# Patient Record
Sex: Male | Born: 1945 | Race: White | Hispanic: No | Marital: Married | Smoking: Never smoker
Health system: Southern US, Community
[De-identification: ages and names within clinical notes are randomized; demographics above are authoritative.]

## PROBLEM LIST (undated history)

## (undated) DIAGNOSIS — N189 Chronic kidney disease, unspecified: Secondary | ICD-10-CM

## (undated) DIAGNOSIS — I10 Essential (primary) hypertension: Secondary | ICD-10-CM

## (undated) DIAGNOSIS — E119 Type 2 diabetes mellitus without complications: Secondary | ICD-10-CM

## (undated) DIAGNOSIS — E785 Hyperlipidemia, unspecified: Secondary | ICD-10-CM

## (undated) DIAGNOSIS — I499 Cardiac arrhythmia, unspecified: Secondary | ICD-10-CM

## (undated) HISTORY — DX: Type 2 diabetes mellitus without complications: E11.9

## (undated) HISTORY — DX: Essential (primary) hypertension: I10

## (undated) HISTORY — DX: Hyperlipidemia, unspecified: E78.5

## (undated) HISTORY — DX: Cardiac arrhythmia, unspecified: I49.9

## (undated) HISTORY — DX: Chronic kidney disease, unspecified: N18.9

---

## 1999-06-07 DIAGNOSIS — Z86711 Personal history of pulmonary embolism: Secondary | ICD-10-CM | POA: Insufficient documentation

## 2013-01-09 DIAGNOSIS — H43819 Vitreous degeneration, unspecified eye: Secondary | ICD-10-CM | POA: Insufficient documentation

## 2013-12-06 DIAGNOSIS — N400 Enlarged prostate without lower urinary tract symptoms: Secondary | ICD-10-CM | POA: Insufficient documentation

## 2013-12-06 DIAGNOSIS — D696 Thrombocytopenia, unspecified: Secondary | ICD-10-CM | POA: Insufficient documentation

## 2013-12-06 DIAGNOSIS — F419 Anxiety disorder, unspecified: Secondary | ICD-10-CM | POA: Insufficient documentation

## 2013-12-06 DIAGNOSIS — J45909 Unspecified asthma, uncomplicated: Secondary | ICD-10-CM | POA: Insufficient documentation

## 2013-12-06 DIAGNOSIS — I10 Essential (primary) hypertension: Secondary | ICD-10-CM | POA: Insufficient documentation

## 2013-12-06 DIAGNOSIS — E782 Mixed hyperlipidemia: Secondary | ICD-10-CM | POA: Insufficient documentation

## 2013-12-06 DIAGNOSIS — R0602 Shortness of breath: Secondary | ICD-10-CM | POA: Insufficient documentation

## 2014-08-20 DIAGNOSIS — J301 Allergic rhinitis due to pollen: Secondary | ICD-10-CM | POA: Insufficient documentation

## 2015-02-23 DIAGNOSIS — M316 Other giant cell arteritis: Secondary | ICD-10-CM | POA: Insufficient documentation

## 2015-08-18 DIAGNOSIS — R972 Elevated prostate specific antigen [PSA]: Secondary | ICD-10-CM | POA: Insufficient documentation

## 2021-06-06 DIAGNOSIS — Z7901 Long term (current) use of anticoagulants: Secondary | ICD-10-CM | POA: Insufficient documentation

## 2021-07-15 DIAGNOSIS — I4891 Unspecified atrial fibrillation: Secondary | ICD-10-CM | POA: Insufficient documentation

## 2021-07-15 DIAGNOSIS — I480 Paroxysmal atrial fibrillation: Secondary | ICD-10-CM | POA: Insufficient documentation

## 2021-08-09 ENCOUNTER — Ambulatory Visit: Payer: Medicare Other | Admitting: Cardiology

## 2021-08-12 ENCOUNTER — Ambulatory Visit: Payer: Medicare Other | Admitting: Cardiology

## 2021-08-12 ENCOUNTER — Other Ambulatory Visit: Payer: Self-pay

## 2021-08-12 ENCOUNTER — Encounter: Payer: Self-pay | Admitting: Cardiology

## 2021-08-12 VITALS — BP 160/90 | HR 76 | Temp 98.7°F | Resp 16 | Ht 71.0 in | Wt 272.2 lb

## 2021-08-12 DIAGNOSIS — I480 Paroxysmal atrial fibrillation: Secondary | ICD-10-CM

## 2021-08-12 DIAGNOSIS — I10 Essential (primary) hypertension: Secondary | ICD-10-CM

## 2021-08-12 DIAGNOSIS — E782 Mixed hyperlipidemia: Secondary | ICD-10-CM

## 2021-08-12 DIAGNOSIS — I5032 Chronic diastolic (congestive) heart failure: Secondary | ICD-10-CM

## 2021-08-12 DIAGNOSIS — E1122 Type 2 diabetes mellitus with diabetic chronic kidney disease: Secondary | ICD-10-CM

## 2021-08-12 MED ORDER — HYDRALAZINE HCL 25 MG PO TABS: 25.0000 mg | ORAL_TABLET | Freq: Three times a day (TID) | ORAL | 2 refills | Status: DC

## 2021-08-12 MED ORDER — ISOSORBIDE DINITRATE 30 MG PO TABS: 30.0000 mg | ORAL_TABLET | Freq: Three times a day (TID) | ORAL | 2 refills | Status: DC

## 2021-08-12 NOTE — Progress Notes (Signed)
Primary Physician/Referring:  Kathalene Frames, MD  Patient ID: Omar Chen, male    DOB: Feb 03, 1946, 76 y.o.   MRN: [id number]  Chief Complaint  Patient presents with   Atrial Fibrillation   New Patient (Initial Visit)    Referred by Okey Dupre   HPI:    Omar Chen  is a 76 y.o. Caucasian male patient with history of primary hypertension, hyperlipidemia, type 2 diabetes mellitus with chronic kidney disease, morbid obesity, was in [facility] and admitted for ventral hernia repair developed postoperative complications needing long stay in the hospital and also developed paroxysmal atrial fibrillation underwent cardioversion and started on amiodarone and discharged home on Eliquis, he now presents to establish cardiac care as he was advised to follow-up with and establish with cardiology for further management of his cardiac issues.  He is accompanied by his wife.  States that he is slowly recuperating from his recent surgery, however he has home physical therapy, has slowly started ambulating and was using a walker until a week ago to 2 weeks ago.  He has chronic dyspnea on exertion but has gotten worse since recent hospitalization.  No PND or orthopnea.  Denies chest pain or palpitations.  He would like to come off of amiodarone.  Past Medical History:  Diagnosis Date   Arrhythmia    Atrial fib post surgery Dec 2022   Chronic kidney disease    Diabetes mellitus without complication ([place])    Hyperlipidemia    Hypertension    History reviewed. No pertinent surgical history. Family History  Problem Relation Age of Onset   Rheum arthritis Mother    Cancer - Lung Father    Pancreatic cancer Sister     Social History   Tobacco Use   Smoking status: Never   Smokeless tobacco: Never  Substance Use Topics   Alcohol use: Yes    Alcohol/week: 1.0 standard drink    Types: 1 Standard drinks or equivalent per week   Marital Status:    ROS  Review of  Systems  Cardiovascular:  Positive for dyspnea on exertion and leg swelling. Negative for chest pain.  Objective  Blood pressure (!) 160/90, pulse 76, temperature 98.7 F (37.1 C), temperature source Temporal, resp. rate 16, height 5\' 11"  (1.803 m), weight 272 lb 3.2 oz (123.5 kg), SpO2 92 %. Body mass index is 37.96 kg/m.  Vitals with BMI 08/12/2021 08/12/2021  Height - 5\' 11"   Weight - 272 lbs 3 oz  BMI - [id number]  Systolic 0000000 [id number]  Diastolic 90 [id number]  Pulse - 76    Physical Exam Constitutional:      Comments: Morbidly obese in no acute distress.  Neck:     Vascular: No JVD.  Cardiovascular:     Rate and Rhythm: Normal rate and regular rhythm.     Pulses: Intact distal pulses.          Carotid pulses are 2+ on the right side and 2+ on the left side.      Popliteal pulses are 2+ on the right side and 2+ on the left side.       Dorsalis pedis pulses are 1+ on the right side and 0 on the left side.       Posterior tibial pulses are 2+ on the right side and 0 on the left side.     Heart sounds: Normal heart sounds. No murmur heard.   No gallop.  Pulmonary:     Effort: Pulmonary  effort is normal.     Breath sounds: Rales (Bibasilar [address] up) present.  Abdominal:     General: Bowel sounds are normal.     Palpations: Abdomen is soft.     Comments: Obese. Pannus present  Musculoskeletal:     Right lower leg: Edema (Non pitting edema with chronic venostasis dermatitis) present.     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     Medications and allergies  No Known Allergies   Medication list after today's encounter   Current Outpatient Medications:    amLODipine-benazepril (LOTREL) 10-20 MG capsule, Take 1 capsule by mouth See admin instructions., Disp: , Rfl:    ELIQUIS 5 MG TABS tablet, Take 5 mg by mouth 2 (two) times daily., Disp: , Rfl:    fluticasone (FLONASE) 50 MCG/ACT nasal spray, Place 1-2 sprays into the nose as needed for  allergies., Disp: , Rfl:    furosemide (LASIX) 20 MG tablet, Take 20 mg by mouth., Disp: , Rfl:    hydrALAZINE (APRESOLINE) 25 MG tablet, Take 1 tablet (25 mg total) by mouth 3 (three) times daily., Disp: 90 tablet, Rfl: 2   isosorbide dinitrate (ISORDIL) 30 MG tablet, Take 1 tablet (30 mg total) by mouth 3 (three) times daily., Disp: 90 tablet, Rfl: 2   metoprolol succinate (TOPROL-XL) 25 MG 24 hr tablet, Take 1 tablet by mouth daily., Disp: , Rfl:    olopatadine (PATANOL) 0.1 % ophthalmic solution, Apply 1-2 drops to eye., Disp: , Rfl:    pravastatin (PRAVACHOL) 40 MG tablet, Take 1 tablet by mouth daily., Disp: , Rfl:    TRADJENTA 5 MG TABS tablet, Take 5 mg by mouth daily., Disp: , Rfl:   Laboratory examination:   No results for input(s): NA, K, CL, CO2, GLUCOSE, BUN, CREATININE, CALCIUM, GFRNONAA, GFRAA in the last 8760 hours. CrCl cannot be calculated (No successful lab value found.).  No flowsheet data found. No flowsheet data found.  Lipid Panel No results for input(s): CHOL, TRIG, LDLCALC, VLDL, HDL, CHOLHDL, LDLDIRECT in the last 8760 hours. Lipid Panel  No results found for: CHOL, TRIG, HDL, CHOLHDL, VLDL, LDLCALC, LDLDIRECT, LABVLDL   HEMOGLOBIN A1C No results found for: HGBA1C, MPG TSH No results for input(s): TSH in the last 8760 hours.  Radiology:     Cardiac Studies:    EKG:   EKG 08/12/2021: Sinus rhythm with first-degree AV block at rate of 79 bpm, left atrial enlargement, left axis deviation, left anterior fascicular block.  Poor R wave progression, cannot exclude anteroseptal infarct old.  Nonspecific T abnormality.    Assessment     ICD-10-CM   1. Paroxysmal atrial fibrillation (HCC)  I48.0 EKG 12-Lead    2. Mixed hyperlipidemia  E78.2     3. Primary hypertension  I10 hydrALAZINE (APRESOLINE) 25 MG tablet    isosorbide dinitrate (ISORDIL) 30 MG tablet    CBC    COMPLETE METABOLIC PANEL WITH GFR    Lipid Panel With LDL/HDL Ratio    TSH    4. Type 2  diabetes mellitus with stage 3a chronic kidney disease, without long-term current use of insulin (HCC)  E11.22 Hgb A1c w/o eAG   N18.31     5. Chronic diastolic (congestive) heart failure (HCC)  I50.32 DG Chest Portable 2 Views    Pro b natriuretic peptide (BNP)       Medications Discontinued During This Encounter  Medication Reason   amiodarone (PACERONE) 200 MG tablet  Discontinued by provider    Meds ordered this encounter  Medications   hydrALAZINE (APRESOLINE) 25 MG tablet    Sig: Take 1 tablet (25 mg total) by mouth 3 (three) times daily.    Dispense:  90 tablet    Refill:  2   isosorbide dinitrate (ISORDIL) 30 MG tablet    Sig: Take 1 tablet (30 mg total) by mouth 3 (three) times daily.    Dispense:  90 tablet    Refill:  2   Orders Placed This Encounter  Procedures   DG Chest Portable 2 Views    Standing Status:   Future    Standing Expiration Date:   08/13/2022    Order Specific Question:   Reason for Exam (SYMPTOM  OR DIAGNOSIS REQUIRED)    Answer:   Shortness of breath    Order Specific Question:   Preferred imaging location?    Answer:   [facility]   CBC   COMPLETE METABOLIC PANEL WITH GFR   Lipid Panel With LDL/HDL Ratio   TSH   Pro b natriuretic peptide (BNP)   Hgb A1c w/o eAG   EKG 12-Lead   Recommendations:   Joaquim Buttry is a 76 y.o.  Caucasian male patient with history of primary hypertension, hyperlipidemia, type 2 diabetes mellitus with chronic kidney disease, morbid obesity, was in [facility] and admitted for ventral hernia repair developed postoperative complications needing long stay in the hospital and also developed paroxysmal atrial fibrillation underwent cardioversion and started on amiodarone and discharged home on Eliquis, he now presents to establish cardiac care as he was advised to follow-up with and establish with cardiology for further management of his cardiac issues.  He is accompanied by his wife.  Extremely complex  patient with multiple medical comorbidities, presents to establish cardiac care, he is not in acute decompensated heart failure but does have bibasilar crackles, would like to obtain a baseline chest x-ray, suspect atelectasis.  Blood pressure is markedly elevated, I started him on isosorbide dinitrate and hydralazine 3 times daily.  Complete set of labs ordered as above.  We will send a copy to Dr. Okey Dupre.  We will try to obtain his cardiac work-up from previous.  Does not appear that he has had cardiac work-up.  With regard to atrial fibrillation, he is maintaining sinus rhythm, suspect sepsis related and acute decompensated heart failure related atrial fibrillation when he was admitted postoperatively after complications from ventral hernia repair in [place].  They are very eager to come off of amiodarone which I have discontinued for now.  I will look at his prior cardiac work-up and depending upon his previous work-up, he may need further work-up.  Including ischemic work-up.  I would like to see him back in 4 weeks for follow-up.  I have had extensive discussion regarding weight loss and increasing his physical activity. [facility] diet (low glycemic) sheet given to the patient.  Discussed regarding DASH diet and salt restriction.  This is a 90-minute office visit encounter.  I have requested Dr. Henry Russel, his family practitioner at [facility] in [place] to send me cardiology records, his cell phone number is [phone] and his office number is [phone].   Adrian Prows, MD, [facility] 08/12/2021, 12:52 PM Office: [phone]

## 2021-08-16 ENCOUNTER — Other Ambulatory Visit: Payer: Self-pay | Admitting: Cardiology

## 2021-08-16 ENCOUNTER — Ambulatory Visit
Admission: RE | Admit: 2021-08-16 | Discharge: 2021-08-16 | Disposition: A | Discharge status: Home / Self Care | Payer: Medicare Other | Source: Ambulatory Visit | Attending: Cardiology | Admitting: Cardiology

## 2021-08-16 DIAGNOSIS — I5032 Chronic diastolic (congestive) heart failure: Secondary | ICD-10-CM

## 2021-08-16 NOTE — Progress Notes (Signed)
Chest x-ray PA lateral view 08/16/2021: ?The heart and mediastinal contours are within normal limits. Aortic calcification. Slightly prominent hilar vasculature. ?No focal consolidation. No pulmonary edema. No pleural effusion. No pneumothorax.

## 2021-08-17 LAB — LIPID PANEL WITH LDL/HDL RATIO
Cholesterol, Total: 157 mg/dL (ref 100–199)
HDL: 52 mg/dL (ref >39)
LDL Chol Calc (NIH): 87 mg/dL (ref 0–99)
LDL/HDL Ratio: 1.7 ratio (ref 0.0–3.6)
Triglycerides: 99 mg/dL (ref 0–149)
VLDL Cholesterol Cal: 18 mg/dL (ref 5–40)

## 2021-08-17 LAB — CBC
Hematocrit: 36.4 % — ABNORMAL LOW (ref 37.5–51.0)
Hemoglobin: 12.3 g/dL — ABNORMAL LOW (ref 13.0–17.7)
MCH: 29.4 pg (ref 26.6–33.0)
MCHC: 33.8 g/dL (ref 31.5–35.7)
MCV: 87 fL (ref 79–97)
Platelets: 183 10*3/uL (ref 150–450)
RBC: 4.18 x10E6/uL (ref 4.14–5.80)
RDW: 15.1 % (ref 11.6–15.4)
WBC: 8.5 10*3/uL (ref 3.4–10.8)

## 2021-08-17 LAB — HGB A1C W/O EAG: Hgb A1c MFr Bld: 5.9 % — ABNORMAL HIGH (ref 4.8–5.6)

## 2021-08-17 LAB — TSH: TSH: 3.58 u[IU]/mL (ref 0.450–4.500)

## 2021-08-17 LAB — PRO B NATRIURETIC PEPTIDE: NT-Pro BNP: 662 pg/mL — ABNORMAL HIGH (ref 0–376)

## 2021-09-06 ENCOUNTER — Encounter: Payer: Self-pay | Admitting: Cardiology

## 2021-09-06 ENCOUNTER — Ambulatory Visit: Payer: Medicare Other | Admitting: Cardiology

## 2021-09-06 VITALS — BP 142/60 | HR 67 | Temp 98.3°F | Resp 16 | Ht 71.0 in | Wt 277.6 lb

## 2021-09-06 DIAGNOSIS — I5032 Chronic diastolic (congestive) heart failure: Secondary | ICD-10-CM

## 2021-09-06 DIAGNOSIS — I10 Essential (primary) hypertension: Secondary | ICD-10-CM

## 2021-09-06 DIAGNOSIS — E782 Mixed hyperlipidemia: Secondary | ICD-10-CM

## 2021-09-06 DIAGNOSIS — I480 Paroxysmal atrial fibrillation: Secondary | ICD-10-CM

## 2021-09-06 NOTE — Progress Notes (Addendum)
? ?Primary Physician/Referring:  Kathalene Frames, MD ? ?Patient ID: Omar Chen, male    DOB: 1945/08/14, 76 y.o.   MRN: [id number] ? ?No chief complaint on file. ? ?HPI:   ? ?Omar Chen  is a 76 y.o. Caucasian male patient with history of primary hypertension, hyperlipidemia, type 2 diabetes mellitus with chronic kidney disease, morbid obesity, was in [facility] and admitted for ventral hernia repair developed postoperative complications needing long stay in the hospital and also developed paroxysmal atrial fibrillation underwent cardioversion and started on amiodarone and discharged home on Eliquis. He is accompanied by his wife.  I had seen him 6 weeks ago. ? ?He and his wife have made significant lifestyle changes with regard to diet.  He is also ambulating much more frequently.  Home physical therapy has seen a significant improvement in his overall activity and balance as well.  Dyspnea has remained stable.  No PND or orthopnea.  Denies chest pain or palpitations.    ? ?Past Medical History:  ?Diagnosis Date  ? Arrhythmia   ? Atrial fib post surgery Dec 2022  ? Chronic kidney disease   ? Diabetes mellitus without complication ([place])   ? Hyperlipidemia   ? Hypertension   ? ?History reviewed. No pertinent surgical history. ?Family History  ?Problem Relation Age of Onset  ? Rheum arthritis Mother   ? Cancer - Lung Father   ? Pancreatic cancer Sister   ?  ?Social History  ? ?Tobacco Use  ? Smoking status: Never  ? Smokeless tobacco: Never  ?Substance Use Topics  ? Alcohol use: Yes  ?  Alcohol/week: 1.0 standard drink  ?  Types: 1 Standard drinks or equivalent per week  ? ?Marital Status:    ?ROS  ?Review of Systems  ?Cardiovascular:  Positive for dyspnea on exertion and leg swelling. Negative for chest pain.  ?Objective  ?Blood pressure (!) 142/60, pulse 67, temperature 98.3 ?F (36.8 ?C), temperature source Temporal, resp. rate 16, height $RemoveBe'5\' 11"'HOEhuKCRB$  (1.803 m), weight 277 lb 9.6 oz (125.9  kg), SpO2 95 %. Body mass index is 38.72 kg/m?.  ? ?  09/06/2021  ? 10:39 AM 09/06/2021  ? 10:30 AM 08/12/2021  ? 11:54 AM  ?Vitals with BMI  ?Height  $Remove'5\' 11"'vWtyndw$    ?Weight  277 lbs 10 oz   ?BMI  38.73   ?Systolic [phone]  ?Diastolic 60 70 90  ?Pulse  67   ?  ?Physical Exam ?Constitutional:   ?   Comments: Morbidly obese in no acute distress.  ?Neck:  ?   Vascular: No JVD.  ?Cardiovascular:  ?   Rate and Rhythm: Normal rate and regular rhythm.  ?   Pulses: Intact distal pulses.     ?     Carotid pulses are 2+ on the right side and 2+ on the left side. ?     Popliteal pulses are 2+ on the right side and 2+ on the left side.  ?     Dorsalis pedis pulses are 1+ on the right side and 0 on the left side.  ?     Posterior tibial pulses are 2+ on the right side and 0 on the left side.  ?   Heart sounds: Normal heart sounds. No murmur heard. ?  No gallop.  ?Pulmonary:  ?   Effort: Pulmonary effort is normal.  ?   Breath sounds: Normal breath sounds. No rales.  ?Abdominal:  ?   General: Bowel sounds are  normal.  ?   Palpations: Abdomen is soft.  ?   Comments: Obese. Pannus present  ?Musculoskeletal:  ?   Right lower leg: Edema (Non pitting edema with chronic venostasis dermatitis) present.  ?   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  ?  ?Medications and allergies  ?No Known Allergies  ? ?Medication list after today's encounter  ? ?Current Outpatient Medications:  ?  amLODipine-benazepril (LOTREL) 10-20 MG capsule, Take 1 capsule by mouth See admin instructions., Disp: , Rfl:  ?  ELIQUIS 5 MG TABS tablet, Take 5 mg by mouth 2 (two) times daily., Disp: , Rfl:  ?  fluticasone (FLONASE) 50 MCG/ACT nasal spray, Place 1-2 sprays into the nose as needed for allergies., Disp: , Rfl:  ?  furosemide (LASIX) 20 MG tablet, Take 20 mg by mouth., Disp: , Rfl:  ?  hydrALAZINE (APRESOLINE) 25 MG tablet, Take 1 tablet (25 mg total) by mouth 3 (three) times daily., Disp: 90 tablet, Rfl: 2 ?  isosorbide  dinitrate (ISORDIL) 30 MG tablet, Take 1 tablet (30 mg total) by mouth 3 (three) times daily., Disp: 90 tablet, Rfl: 2 ?  metoprolol succinate (TOPROL-XL) 25 MG 24 hr tablet, Take 1 tablet by mouth daily., Disp: , Rfl:  ?  pravastatin (PRAVACHOL) 40 MG tablet, Take 1 tablet by mouth daily., Disp: , Rfl:  ?  TRADJENTA 5 MG TABS tablet, Take 5 mg by mouth daily., Disp: , Rfl:  ?  olopatadine (PATANOL) 0.1 % ophthalmic solution, Apply 1-2 drops to eye. (Patient not taking: Reported on 09/06/2021), Disp: , Rfl:  ? ? ?Laboratory examination:  ? ?No results for input(s): NA, K, CL, CO2, GLUCOSE, BUN, CREATININE, CALCIUM, GFRNONAA, GFRAA in the last 8760 hours. ?CrCl cannot be calculated (No successful lab value found.).  ?   ? View : No data to display.  ?  ?  ?  ? ? ?  Latest Ref Rng & Units 08/16/2021  ? 10:32 AM  ?CBC  ?WBC 3.4 - 10.8 x10E3/uL 8.5    ?Hemoglobin 13.0 - 17.7 g/dL 12.3    ?Hematocrit 37.5 - 51.0 % 36.4    ?Platelets 150 - 450 x10E3/uL 183    ? ?Lipid Panel ?Recent Labs  ?  08/16/21 ?1032  ?CHOL 157  ?TRIG 99  ?[place] 87  ?HDL 52  ? ?Lipid Panel  ?   ?Component Value Date/Time  ? CHOL 157 08/16/2021 1032  ? TRIG 99 08/16/2021 1032  ? HDL 52 08/16/2021 1032  ? [place] 87 08/16/2021 1032  ? LABVLDL 18 08/16/2021 1032  ?  ? ?HEMOGLOBIN A1C ?Lab Results  ?Component Value Date  ? HGBA1C 5.9 (H) 08/16/2021  ? ?TSH ?Recent Labs  ?  08/16/21 ?1032  ?TSH 3.580  ? ? ?Radiology:  ? ?Chest x-ray PA and lateral view 08/16/2021: ?The heart and mediastinal contours are within normal limits. Aortic ?calcification. Slightly prominent hilar vasculature. ?No focal consolidation. No pulmonary edema. No pleural effusion. No ?pneumothorax. ? ?Cardiac Studies:  ? ?TTE 03/27/2021:  ?Normal LV systolic function, EF 55 to 60%.  Normal RV systolic function.  No pericardial effusion.  Left atrium is moderately enlarged.  ? ?TEE guided direct-current cardioversion 03/29/2021:  ?Normal TEE with normal RV systolic function.  No thrombus  in the left atrial appendage.  ?Successful direct-current cardioversion with 300 J x 1 from atrial fibrillation to normal sinus rhythm. ? ?EKG:  ? ?EKG 09/06/2021: ?Sinus rhythm sinus rhythm with vestibular block  at rate of 65 bpm, normal axis, poor R wave progression, cannot explain to septal infarct old.  Nonspecific T abnormality.  Compared to 08/12/2021, left axis deviation not present. ? ?Assessment  ? ?  ICD-10-CM   ?1. Paroxysmal atrial fibrillation (HCC)  I48.0 EKG 12-Lead  ?  PCV MYOCARDIAL PERFUSION WITH LEXISCAN  ?  ?2. Mixed hyperlipidemia  E78.2   ?  ?3. Primary hypertension  I10 CMP14+EGFR  ?  ?4. Chronic diastolic (congestive) heart failure (HCC)  I50.32 Pro b natriuretic peptide (BNP)  ?  PCV MYOCARDIAL PERFUSION WITH LEXISCAN  ?  ? ?  ?There are no discontinued medications. ?  ?No orders of the defined types were placed in this encounter. ? ?Orders Placed This Encounter  ?Procedures  ? Pro b natriuretic peptide (BNP)  ? CMP14+EGFR  ? PCV MYOCARDIAL PERFUSION WITH LEXISCAN  ?  Standing Status:   Future  ?  Standing Expiration Date:   11/06/2021  ? EKG 12-Lead  ? ?Recommendations:  ? ?Hanna Aultman is a 76 y.o.  Caucasian male patient with history of primary hypertension, hyperlipidemia, type 2 diabetes mellitus with chronic kidney disease, morbid obesity, was in [facility] and admitted for ventral hernia repair developed postoperative complications needing long stay in the hospital and also developed paroxysmal atrial fibrillation underwent cardioversion and started on amiodarone and discharged home on Eliquis. He is accompanied by his wife.  I had seen him on 08/12/2021.  I had added isosorbide dinitrate and hydralazine and discontinued amiodarone on his last office visit. ? ?I was able to obtain the echocardiogram that was performed on him, I have updated the echocardiogram and the TEE report.  I spoke to his PCP personally. ? ?His blood pressure is markedly improved.  He has made it a  significant amount of lifestyle changes with regard to his diet.  Although his weight is up, I am very reassured that he has good potential of weight loss and also control of his heart failure and blood press

## 2021-09-06 NOTE — Progress Notes (Signed)
EKG 09/06/2021: Sinus rhythm sinus rhythm with vestibular block at rate of 65 bpm, normal axis, poor R wave progression, cannot explain to septal infarct old.  Nonspecific T abnormality.  Compared to 08/12/2021, left axis deviation not present.

## 2021-09-06 NOTE — Addendum Note (Signed)
Addended by: Kela Millin on: 09/06/2021 11:13 AM ? ? Modules accepted: Level of Service ? ?

## 2021-09-22 ENCOUNTER — Ambulatory Visit: Payer: Medicare Other

## 2021-09-22 DIAGNOSIS — I480 Paroxysmal atrial fibrillation: Secondary | ICD-10-CM

## 2021-09-22 DIAGNOSIS — I5032 Chronic diastolic (congestive) heart failure: Secondary | ICD-10-CM

## 2021-09-27 ENCOUNTER — Telehealth: Payer: Self-pay

## 2021-09-27 NOTE — Telephone Encounter (Signed)
Patient called and stated that pharmacy only prescribed him 56 tablets, but I explained to him that they are refilling from a previous prescription, that he recently filled with 60 tablets, so I think the issue may be his insurance only paying for 56 tablets. He will contact the pharmacy. ? ?

## 2021-10-05 ENCOUNTER — Telehealth: Payer: Self-pay

## 2021-10-05 NOTE — Telephone Encounter (Signed)
Patient called for his stress test results. Please advise.  ?

## 2021-10-05 NOTE — Telephone Encounter (Signed)
Normal stress test and normal heart function

## 2021-10-08 NOTE — Telephone Encounter (Signed)
Called and spoke with patient regarding his stress test results.

## 2021-10-29 ENCOUNTER — Telehealth: Payer: Self-pay | Admitting: Cardiology

## 2021-10-29 ENCOUNTER — Other Ambulatory Visit: Payer: Self-pay

## 2021-10-29 DIAGNOSIS — I10 Essential (primary) hypertension: Secondary | ICD-10-CM

## 2021-10-29 MED ORDER — ISOSORBIDE DINITRATE 30 MG PO TABS: 30.0000 mg | ORAL_TABLET | Freq: Three times a day (TID) | ORAL | 2 refills | Status: DC

## 2021-10-29 MED ORDER — HYDRALAZINE HCL 25 MG PO TABS: 25.0000 mg | ORAL_TABLET | Freq: Three times a day (TID) | ORAL | 2 refills | Status: DC

## 2021-10-29 NOTE — Telephone Encounter (Signed)
Patient is requesting refills on his blood pressure medications. He has an appointment to discuss medication with JG on 11/12/21, but he's concerned he will run out by then.

## 2021-10-29 NOTE — Telephone Encounter (Signed)
Called and spoke to patient medication has been refilled

## 2021-11-02 ENCOUNTER — Other Ambulatory Visit: Payer: Self-pay

## 2021-11-02 ENCOUNTER — Telehealth: Payer: Self-pay | Admitting: Cardiology

## 2021-11-02 DIAGNOSIS — I10 Essential (primary) hypertension: Secondary | ICD-10-CM

## 2021-11-02 MED ORDER — ISOSORBIDE DINITRATE 30 MG PO TABS: 30.0000 mg | ORAL_TABLET | Freq: Three times a day (TID) | ORAL | 2 refills | Status: DC

## 2021-11-02 NOTE — Telephone Encounter (Signed)
Patient says he is taking two blood pressure medications. When he went to the pharmacy to pick them up, the pharmacy only had one of them (couldn't remember which one the pharmacy had). He's asking if he still needs to take both of these medications or if this means he is to discontinue the one the pharmacy did not refill.

## 2021-11-02 NOTE — Telephone Encounter (Signed)
Can you call pharmacy and find out. Jasmyne, forward to MA next time so they can investigate first

## 2021-11-03 ENCOUNTER — Encounter (HOSPITAL_BASED_OUTPATIENT_CLINIC_OR_DEPARTMENT_OTHER): Payer: Self-pay

## 2021-11-11 ENCOUNTER — Ambulatory Visit: Payer: Medicare Other | Admitting: Cardiology

## 2021-11-11 NOTE — Telephone Encounter (Signed)
Cathlean Cower Mares MA called pt and pharmacy.

## 2021-11-12 ENCOUNTER — Encounter: Payer: Self-pay | Admitting: Cardiology

## 2021-11-12 ENCOUNTER — Ambulatory Visit: Payer: Medicare Other | Admitting: Cardiology

## 2021-11-12 VITALS — BP 158/84 | HR 69 | Temp 98.1°F | Resp 17 | Ht 71.0 in | Wt 253.2 lb

## 2021-11-12 DIAGNOSIS — N1832 Chronic kidney disease, stage 3b: Secondary | ICD-10-CM

## 2021-11-12 DIAGNOSIS — I480 Paroxysmal atrial fibrillation: Secondary | ICD-10-CM

## 2021-11-12 DIAGNOSIS — I10 Essential (primary) hypertension: Secondary | ICD-10-CM

## 2021-11-12 DIAGNOSIS — I5032 Chronic diastolic (congestive) heart failure: Secondary | ICD-10-CM

## 2021-11-12 MED ORDER — RIVAROXABAN 15 MG PO TABS: 15.0000 mg | ORAL_TABLET | Freq: Every day | ORAL | 1 refills | Status: DC

## 2021-11-12 NOTE — Progress Notes (Signed)
Primary Physician/Referring:  Kathalene Frames, MD  Patient ID: Omar Chen, male    DOB: Sep 30, 1945, 76 y.o.   MRN: [id number]  Chief Complaint  Patient presents with   Follow-up   Paroxysmal atrial fibrillation [facility])    HPI:    Omar Chen  is a 76 y.o. 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 He is accompanied by his wife.    I discontinued amiodarone on 08/12/2021. He and his wife have made significant lifestyle changes with regard to diet.  He is also ambulating much more frequently.  Home physical therapy has seen a significant improvement in his overall activity and balance as well.  Dyspnea has remained stable.  No PND or orthopnea.  Denies chest pain or palpitations.   Lost about 25 pounds of weight to 30 pounds since he last saw me.  Past Medical History:  Diagnosis Date   Arrhythmia    Atrial fib post surgery Dec 2022   Chronic kidney disease    Diabetes mellitus without complication ([place])    Hyperlipidemia    Hypertension    History reviewed. No pertinent surgical history. Family History  Problem Relation Age of Onset   Rheum arthritis Mother    Cancer - Lung Father    Pancreatic cancer Sister     Social History   Tobacco Use   Smoking status: Never   Smokeless tobacco: Never  Substance Use Topics   Alcohol use: Yes    Alcohol/week: 1.0 standard drink of alcohol    Types: 1 Standard drinks or equivalent per week   Marital Status:    ROS  Review of Systems  Cardiovascular:  Positive for dyspnea on exertion and leg swelling. Negative for chest pain.   Objective   Blood pressure (!) 158/84, pulse 69, temperature 98.1 F (36.7 C), temperature source Temporal, resp. rate 17, height $RemoveBe'5\' 11"'BEREKSoxm$  (1.803 m), weight 253 lb 3.2 oz (114.9 kg), SpO2 95 %. Body mass index is 35.31 kg/m.     11/12/2021   12:41 PM 11/12/2021   11:45 AM 11/12/2021   11:44 AM  Vitals with BMI  Height   '5\' 11"'$   Weight   253 lbs 3 oz  BMI   44.81  Systolic 856 314 970  Diastolic 84 86 90  Pulse  69 69    Physical Exam Neck:     Vascular: No JVD.  Cardiovascular:     Rate and Rhythm: Normal rate and regular rhythm.     Pulses: Intact distal pulses.          Carotid pulses are 2+ on the right side and 2+ on the left side.      Popliteal pulses are 2+ on the right side and 2+ on the left side.       Dorsalis pedis pulses are 1+ on the right side and 0 on the left side.       Posterior tibial pulses are 2+ on the right side and 0 on the left side.     Heart sounds: Normal heart sounds. No murmur heard.    No gallop.  Pulmonary:     Effort: Pulmonary effort is normal.     Breath sounds:  Normal breath sounds. No rales.  Abdominal:     General: Bowel sounds are normal.     Palpations: Abdomen is soft.     Comments: Obese. Pannus present  Musculoskeletal:     Right lower leg: Edema (Non pitting edema with chronic venostasis dermatitis) present.     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     Medications and allergies  No Known Allergies   Medication list after today's encounter   Current Outpatient Medications:    amLODipine-benazepril (LOTREL) 10-20 MG capsule, Take 1 capsule by mouth See admin instructions., Disp: , Rfl:    FARXIGA 10 MG TABS tablet, Take 5 mg by mouth daily., Disp: , Rfl:    fluticasone (FLONASE) 50 MCG/ACT nasal spray, Place 1-2 sprays into the nose as needed for allergies., Disp: , Rfl:    furosemide (LASIX) 20 MG tablet, Take 20 mg by mouth., Disp: , Rfl:    hydrALAZINE (APRESOLINE) 25 MG tablet, Take 1  tablet by mouth in the morning, at noon, and at bedtime., Disp: , Rfl:    isosorbide dinitrate (ISORDIL) 30 MG tablet, Take 1 tablet (30 mg total) by mouth 3 (three) times daily., Disp: 90 tablet, Rfl: 2   metoprolol succinate (TOPROL-XL) 25 MG 24 hr tablet, Take 1 tablet by mouth daily., Disp: , Rfl:    mupirocin ointment (BACTROBAN) 2 %, Apply 1 application  topically 2 (two) times daily., Disp: , Rfl:    olopatadine (PATANOL) 0.1 % ophthalmic solution, Apply 1-2 drops to eye., Disp: , Rfl:    pravastatin (PRAVACHOL) 40 MG tablet, Take 1 tablet by mouth daily., Disp: , Rfl:    Rivaroxaban (XARELTO) 15 MG TABS tablet, Take 1 tablet (15 mg total) by mouth daily with supper., Disp: 30 tablet, Rfl: 1   Laboratory examination:   No results for input(s): "NA", "K", "CL", "CO2", "GLUCOSE", "BUN", "CREATININE", "CALCIUM", "GFRNONAA", "GFRAA" in the last 8760 hours. CrCl cannot be calculated (No successful lab value found.).      No data to display            Latest Ref Rng & Units 08/16/2021   10:32 AM  CBC  WBC 3.4 - 10.8 x10E3/uL 8.5   Hemoglobin 13.0 - 17.7 g/dL 12.3   Hematocrit 37.5 - 51.0 % 36.4   Platelets 150 - 450 x10E3/uL 183    Lipid Panel Recent Labs    08/16/21 1032  CHOL 157  TRIG 99  LDLCALC 87  HDL 52   Lipid Panel     Component Value Date/Time   CHOL 157 08/16/2021 1032   TRIG 99 08/16/2021 1032   HDL 52 08/16/2021 1032   LDLCALC 87 08/16/2021 1032   LABVLDL 18 08/16/2021 1032     HEMOGLOBIN A1C Lab Results  Component Value Date   HGBA1C 5.9 (H) 08/16/2021   TSH Recent Labs    08/16/21 1032  TSH 3.580   BNP (last 3 results) No results for input(s): "BNP" in the last 8760 hours.  ProBNP (last 3 results) Recent Labs    08/16/21 1032  PROBNP 662*    Labs 09/15/2021:  BUN 30, creatinine 2.02, EGFR 34 mL, potassium 4.0. Radiology:   Chest x-ray PA and lateral view 08/16/2021: The heart and mediastinal contours are within normal limits.  Aortic calcification. Slightly prominent hilar vasculature. No focal consolidation. No pulmonary edema. No pleural effusion. No pneumothorax.  Cardiac Studies:   TTE 03/27/2021:  Normal LV systolic function,  EF 55 to 60%.  Normal RV systolic function.  No pericardial effusion.  Left atrium is moderately enlarged.   TEE guided direct-current cardioversion 03/29/2021:  Normal TEE with normal RV systolic function.  No thrombus in the left atrial appendage.  Successful direct-current cardioversion with 300 J x 1 from atrial fibrillation to normal sinus rhythm.  PCV MYOCARDIAL PERFUSION WITH LEXISCAN 09/22/2021  Lexiscan/modified Bruce nuclear stress test performed using 1-day protocol. SPECT images show decreased tracer uptake in inferior myocardium, likely related to diaphragmatic attenuation. No definitive evidence of ischemia seen. Stress LVEF 77%. Low risk study.    EKG:   EKG 09/06/2021: Sinus rhythm sinus rhythm with vestibular block at rate of 65 bpm, normal axis, poor R wave progression, cannot explain to septal infarct old.  Nonspecific T abnormality.  Compared to 08/12/2021, left axis deviation not present.  Assessment     ICD-10-CM   1. Paroxysmal atrial fibrillation (HCC)  I48.0 EKG 12-Lead    Rivaroxaban (XARELTO) 15 MG TABS tablet    2. Primary hypertension  I10     3. Chronic diastolic (congestive) heart failure (HCC)  I50.32     4. Type 2 diabetes mellitus with stage 3b chronic kidney disease, without long-term current use of insulin (HCC)  E11.22    N18.32       CHA2DS2-VASc Score is 4.  Yearly risk of stroke: 4.8% (A, HTN, CHF, DM).  Score of 1=0.6; 2=2.2; 3=3.2; 4=4.8; 5=7.2; 6=9.8; 7=>9.8) -(CHF; HTN; vasc disease DM,  Male = 1; Age <65 =0; 65-74 = 1,  >75 =2; stroke/embolism= 2).    Medications Discontinued During This Encounter  Medication Reason   hydrALAZINE (APRESOLINE) 25 MG tablet Change in therapy   TRADJENTA 5 MG TABS tablet Change in therapy    ELIQUIS 5 MG TABS tablet Change in therapy     Meds ordered this encounter  Medications   Rivaroxaban (XARELTO) 15 MG TABS tablet    Sig: Take 1 tablet (15 mg total) by mouth daily with supper.    Dispense:  30 tablet    Refill:  1   Orders Placed This Encounter  Procedures   EKG 12-Lead   Recommendations:   Omar Chen is a 76 y.o.  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 He is accompanied by his wife.    I discontinued amiodarone on 08/12/2021, he is still maintaining sinus rhythm.  I suspect his atrial fibrillation episode was probably related to postop complications.  Now he has lost about 25 to 30 pounds in weight over the past 3 months and has made significant lifestyle changes.  Blood pressure is also significantly improved.  Extremely difficult situation with regard to anticoagulation, patient's CHA2DS2-VASc risk score is at least 4.0 hence would be high risk for thrombotic complications.  Cost and also patient feeling very cold due to being on "blood thinners" is dissuaded him from taking the medication.  However after discussions, he is agreeable to taking the medication as long as his PCP and myself make the decision.  Advised him that we could try Xarelto instead of Eliquis to see whether his symptoms would be better and also cost factor would be better.  Other option would  be to place him in  OCEANIC-AF (Asundexian - factor XIa inhibitor PO BID vs Apixaban PO BID in patients with A. Fib for stroke prevention.  I reviewed the results of the nuclear stress test, low risk stress  test.  Overall I am very impressed with his changes in lifestyle, weight loss, I would like to see him back in 6 weeks for follow-up both to discuss regarding further anticoagulation and also to reiterate compliance with weight loss and to follow-up on his blood pressure which was slightly elevated today.  Was previously on hydralazine as well which had started, presently off of it as his blood pressure has been well controlled.  We may have to reinitiate this.   In spite of diagnosis of heart failure, he is not on an ACE inhibitor or an ARB due to stage IIIb chronic kidney disease.    Adrian Prows, MD, [facility] 11/12/2021, 9:32 PM Office: [phone]

## 2021-12-06 ENCOUNTER — Encounter: Payer: Self-pay | Admitting: Cardiology

## 2021-12-06 ENCOUNTER — Ambulatory Visit: Payer: Medicare Other | Admitting: Cardiology

## 2021-12-06 VITALS — BP 130/78 | HR 59 | Temp 98.6°F | Resp 16 | Ht 71.0 in | Wt 254.8 lb

## 2021-12-06 DIAGNOSIS — Z006 Encounter for examination for normal comparison and control in clinical research program: Secondary | ICD-10-CM

## 2021-12-06 DIAGNOSIS — I480 Paroxysmal atrial fibrillation: Secondary | ICD-10-CM

## 2021-12-06 DIAGNOSIS — E782 Mixed hyperlipidemia: Secondary | ICD-10-CM

## 2021-12-06 DIAGNOSIS — I5032 Chronic diastolic (congestive) heart failure: Secondary | ICD-10-CM

## 2021-12-06 DIAGNOSIS — I10 Essential (primary) hypertension: Secondary | ICD-10-CM

## 2021-12-06 MED ORDER — HYDRALAZINE HCL 50 MG PO TABS: 50.0000 mg | ORAL_TABLET | Freq: Two times a day (BID) | ORAL | 3 refills | Status: DC

## 2021-12-06 MED ORDER — ISOSORBIDE DINITRATE 30 MG PO TABS: 30.0000 mg | ORAL_TABLET | Freq: Two times a day (BID) | ORAL | 3 refills | Status: DC

## 2021-12-06 NOTE — Progress Notes (Unsigned)
Primary Physician/Referring:  Emilio Aspen, MD  Patient ID: Omar Chen, male    DOB: February 12, 1946, 76 y.o.   MRN: [id number]  Chief Complaint  Patient presents with   Atrial Fibrillation   Hypertension   Congestive Heart Failure    3 months    HPI:    Omar Chen  is a 76 y.o. 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 I discontinued amiodarone on 08/12/2021, he is still maintaining sinus rhythm.  I suspect his atrial fibrillation episode was probably related to postop complications.  However due to high CHA2DS2-VASc score, he is continued on anticoagulation.  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  Dyspnea has remained stable.  No PND or orthopnea.  Denies chest pain or palpitations.   Lost about 25 pounds of weight to 30 pounds in the past 6 months, additional 3 pounds since her last saw him 6 weeks ago, wife is present..  Past Medical History:  Diagnosis Date   Arrhythmia    Atrial fib post surgery Dec 2022   Chronic kidney disease    Diabetes mellitus without complication (HCC)    Hyperlipidemia    Hypertension    History reviewed. No pertinent surgical history. Family History  Problem Relation Age of Onset   Rheum arthritis Mother    Cancer - Lung Father    Pancreatic cancer Sister     Social History   Tobacco Use   Smoking status: Never   Smokeless tobacco: Never  Substance Use Topics   Alcohol use: Yes    Alcohol/week: 1.0 standard drink of alcohol    Types: 1 Standard drinks or equivalent per week   Marital Status:    ROS  Review of  Systems  Cardiovascular:  Positive for dyspnea on exertion and leg swelling. Negative for chest pain.   Objective  Blood pressure 130/78, pulse (!) 59, temperature 98.6 F (37 C), temperature source Temporal, resp. rate 16, height 5\' 11"  (1.803 m), weight 254 lb 12.8 oz (115.6 kg), SpO2 95 %. Body mass index is 35.54 kg/m.     12/06/2021   10:41 AM 12/06/2021   10:28 AM 11/12/2021   12:41 PM  Vitals with BMI  Height  5\' 11"    Weight  254 lbs 13 oz   BMI  35.55   Systolic 130 154 01/12/2022  Diastolic 78 81 84  Pulse 59 65     Filed Weights   12/06/21 1028  Weight: 254 lb 12.8 oz (115.6 kg)     Physical Exam Neck:     Vascular: No JVD.  Cardiovascular:     Rate and Rhythm: Normal rate and regular rhythm.     Pulses: Intact distal pulses.          Carotid pulses are 2+ on the right side and 2+ on the left side.      Popliteal pulses are 2+ on the right side and 2+ on the left side.       Dorsalis pedis pulses are 1+ on the right side and 0 on the left side.       Posterior tibial pulses are 2+ on the right side and 0  on the left side.     Heart sounds: Normal heart sounds. No murmur heard.    No gallop.  Pulmonary:     Effort: Pulmonary effort is normal.     Breath sounds: Normal breath sounds. No rales.  Abdominal:     General: Bowel sounds are normal.     Palpations: Abdomen is soft.     Comments: Obese. Pannus present  Musculoskeletal:     Right lower leg: Edema (Non pitting edema with chronic venostasis dermatitis) present.     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     Medications and allergies  No Known Allergies   Medication list after today's encounter   Current Outpatient Medications:    amLODipine-benazepril (LOTREL) 10-20 MG capsule, Take 1 capsule by mouth See admin instructions., Disp: , Rfl:    atorvastatin (LIPITOR) 40 MG tablet, Take 40 mg by mouth daily., Disp: , Rfl:    FARXIGA 10 MG TABS tablet, Take 5 mg by  mouth daily., Disp: , Rfl:    fluticasone (FLONASE) 50 MCG/ACT nasal spray, Place 1-2 sprays into the nose as needed for allergies., Disp: , Rfl:    furosemide (LASIX) 20 MG tablet, Take 20 mg by mouth., Disp: , Rfl:    metoprolol succinate (TOPROL-XL) 25 MG 24 hr tablet, Take 1 tablet by mouth daily., Disp: , Rfl:    mupirocin ointment (BACTROBAN) 2 %, Apply 1 application  topically 2 (two) times daily., Disp: , Rfl:    olopatadine (PATANOL) 0.1 % ophthalmic solution, Apply 1-2 drops to eye., Disp: , Rfl:    hydrALAZINE (APRESOLINE) 50 MG tablet, Take 1 tablet (50 mg total) by mouth in the morning and at bedtime., Disp: 180 tablet, Rfl: 3   isosorbide dinitrate (ISORDIL) 30 MG tablet, Take 1 tablet (30 mg total) by mouth 2 (two) times daily., Disp: 180 tablet, Rfl: 3   Rivaroxaban (XARELTO) 15 MG TABS tablet, Take 1 tablet (15 mg total) by mouth daily with supper. (Patient not taking: Reported on 12/06/2021), Disp: 30 tablet, Rfl: 1   Laboratory examination:   No results for input(s): "NA", "K", "CL", "CO2", "GLUCOSE", "BUN", "CREATININE", "CALCIUM", "GFRNONAA", "GFRAA" in the last 8760 hours. CrCl cannot be calculated (No successful lab value found.).      No data to display            Latest Ref Rng & Units 08/16/2021   10:32 AM  CBC  WBC 3.4 - 10.8 x10E3/uL 8.5   Hemoglobin 13.0 - 17.7 g/dL 12.3   Hematocrit 37.5 - 51.0 % 36.4   Platelets 150 - 450 x10E3/uL 183    Lipid Panel Recent Labs    08/16/21 1032  CHOL 157  TRIG 99  LDLCALC 87  HDL 52   Lipid Panel     Component Value Date/Time   CHOL 157 08/16/2021 1032   TRIG 99 08/16/2021 1032   HDL 52 08/16/2021 1032   LDLCALC 87 08/16/2021 1032   LABVLDL 18 08/16/2021 1032     HEMOGLOBIN A1C Lab Results  Component Value Date   HGBA1C 5.9 (H) 08/16/2021   TSH Recent Labs    08/16/21 1032  TSH 3.580   BNP (last 3 results) No results for input(s): "BNP" in the last 8760 hours.  ProBNP (last 3 results) Recent  Labs    08/16/21 1032  PROBNP 662*    Labs 09/15/2021:  BUN 30, creatinine 2.02, EGFR 34 mL, potassium 4.0. Radiology:  Chest x-ray PA and lateral view 08/16/2021: The heart and mediastinal contours are within normal limits. Aortic calcification. Slightly prominent hilar vasculature. No focal consolidation. No pulmonary edema. No pleural effusion. No pneumothorax.  Cardiac Studies:   TTE 03/27/2021:  Normal LV systolic function, EF 55 to 60%.  Normal RV systolic function.  No pericardial effusion.  Left atrium is moderately enlarged.   TEE guided direct-current cardioversion 03/29/2021:  Normal TEE with normal RV systolic function.  No thrombus in the left atrial appendage.  Successful direct-current cardioversion with 300 J x 1 from atrial fibrillation to normal sinus rhythm.  PCV MYOCARDIAL PERFUSION WITH LEXISCAN 09/22/2021  Lexiscan/modified Bruce nuclear stress test performed using 1-day protocol. SPECT images show decreased tracer uptake in inferior myocardium, likely related to diaphragmatic attenuation. No definitive evidence of ischemia seen. Stress LVEF 77%. Low risk study.    EKG:  EKG 12/26/2021: Sinus rhythm with first-degree AV block at rate of 58 bpm, left atrial enlargement, nonspecific T abnormality.  No significant change from 09/06/2021.   Assessment     ICD-10-CM   1. Paroxysmal atrial fibrillation (HCC)  I48.0 EKG 12-Lead    2. Chronic diastolic (congestive) heart failure (HCC)  I50.32     3. Primary hypertension  I10 isosorbide dinitrate (ISORDIL) 30 MG tablet    hydrALAZINE (APRESOLINE) 50 MG tablet    4. Mixed hyperlipidemia  E78.2     5. Research subject  Z00.6       CHA2DS2-VASc Score is 4.  Yearly risk of stroke: 4.8% (A, HTN, CHF, DM).  Score of 1=0.6; 2=2.2; 3=3.2; 4=4.8; 5=7.2; 6=9.8; 7=>9.8) -(CHF; HTN; vasc disease DM,  Male = 1; Age <65 =0; 65-74 = 1,  >75 =2; stroke/embolism= 2).    Medications Discontinued During This Encounter   Medication Reason   pravastatin (PRAVACHOL) 40 MG tablet    isosorbide dinitrate (ISORDIL) 30 MG tablet    hydrALAZINE (APRESOLINE) 25 MG tablet Reorder     Meds ordered this encounter  Medications   isosorbide dinitrate (ISORDIL) 30 MG tablet    Sig: Take 1 tablet (30 mg total) by mouth 2 (two) times daily.    Dispense:  180 tablet    Refill:  3   hydrALAZINE (APRESOLINE) 50 MG tablet    Sig: Take 1 tablet (50 mg total) by mouth in the morning and at bedtime.    Dispense:  180 tablet    Refill:  3   Orders Placed This Encounter  Procedures   EKG 12-Lead   Recommendations:   Omar Chen is a 76 y.o.  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 I discontinued amiodarone on 08/12/2021, he is still maintaining sinus rhythm.  I suspect his atrial fibrillation episode was probably related to postop complications.  However due to high CHA2DS2-VASc score, he is continued on anticoagulation.  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  This is a 6-week office visit.  Been very strict with his diet, has lost a total of 30 pounds since he established with [place], in the last 6 weeks he has lost additional 3 pounds.  I encouraged him to continue to lose more weight.  His leg  edema has completely resolved.  He is also maintaining sinus rhythm.  Blood pressure is well controlled.  No clinical evidence of heart failure.  He is tolerating anticoagulation well.  He is presently on Eliquis to complete his home doses, he plans to switch to Xarelto due to insurance reasons.  He would be an excellent  candidate for OCEANIC-AF (Asundexian - factor XIa inhibitor PO BID vs Apixaban PO BID in patients with A. Fib for stroke prevention.  I will try to obtain a copy of his EKG documenting atrial fibrillation for randomization.  He is interested.  He is having difficulty with taking isosorbide dinitrate and also hydralazine 3 times daily, often missing afternoon doses.  As he continues to lose weight and has made lifestyle changes, blood pressure is also improved, will change it to twice daily dosing however will increase hydralazine from 25 mg to 50 mg.  Otherwise stable from cardiac standpoint, I will see him back in 6 months or sooner if problems.   In spite of diagnosis of heart failure, he is not on an ACE inhibitor or an ARB due to stage IIIb chronic kidney disease.    Adrian Prows, MD, [facility] 12/07/2021, 10:40 AM Office: [phone]

## 2021-12-07 ENCOUNTER — Encounter: Payer: Self-pay | Admitting: Cardiology

## 2021-12-10 ENCOUNTER — Telehealth: Payer: Self-pay

## 2021-12-10 NOTE — Telephone Encounter (Signed)
VM 1110:  Patient called and left a message stating that you recently increased a BP medication, but doesn't know which one and hasn't seen anything changed at the drug store yet. Please advise.

## 2021-12-12 ENCOUNTER — Encounter: Payer: Self-pay | Admitting: Cardiology

## 2021-12-12 ENCOUNTER — Other Ambulatory Visit: Payer: Self-pay | Admitting: Cardiology

## 2021-12-12 DIAGNOSIS — I10 Essential (primary) hypertension: Secondary | ICD-10-CM

## 2021-12-12 NOTE — Telephone Encounter (Signed)
I had increased Hydralazine from 25 mg to 50 mg and he has 25 mg at home, I have sent it to mail order pharmacy and should be arriving soon.

## 2021-12-14 ENCOUNTER — Encounter: Payer: Self-pay | Admitting: Physical Therapy

## 2021-12-14 ENCOUNTER — Ambulatory Visit: Payer: Medicare Other | Attending: Internal Medicine | Admitting: Physical Therapy

## 2021-12-14 ENCOUNTER — Other Ambulatory Visit: Payer: Self-pay

## 2021-12-14 DIAGNOSIS — M6281 Muscle weakness (generalized): Secondary | ICD-10-CM | POA: Diagnosis not present

## 2021-12-14 DIAGNOSIS — R2689 Other abnormalities of gait and mobility: Secondary | ICD-10-CM | POA: Insufficient documentation

## 2021-12-14 NOTE — Therapy (Signed)
OUTPATIENT PHYSICAL THERAPY THORACOLUMBAR EVALUATION   Patient Name: Omar Chen MRN: [id number] DOB:[birth year], 76 y.o., male Today's Date: 12/14/2021   PT End of Session - 12/14/21 1350     Visit Number 1    Date for PT Re-Evaluation 02/08/22    Authorization Type Medicare Part A/B, KX at visit 15    Progress Note Due on Visit 10    PT Start Time 1145    PT Stop Time 1230    PT Time Calculation (min) 45 min    Activity Tolerance Patient tolerated treatment well    Behavior During Therapy [facility] for tasks assessed/performed             Past Medical History:  Diagnosis Date   Arrhythmia    Atrial fib post surgery Dec 2022   Chronic kidney disease    Diabetes mellitus without complication ([place])    Hyperlipidemia    Hypertension    History reviewed. No pertinent surgical history. There are no problems to display for this patient.     REFERRING PROVIDER: Kathalene Frames, MD   REFERRING DIAG: R26.89 (ICD-10-CM) - Other abnormalities of gait and mobility   Rationale for Evaluation and Treatment Rehabilitation  THERAPY DIAG:  Other abnormalities of gait and mobility  Muscle weakness (generalized)  ONSET DATE: following post-op complications with ventral hernia repair, needed long stay in hospital and developed a-fib, underwent cardioversion, lost mobility, strength, endurance  SUBJECTIVE:                                                                                                                                                                                           SUBJECTIVE STATEMENT: Pt referred to O[facility] for deconditioning and mobility challenges following long stay in hospital following post-op complications from a ventral hernia repair performed 03/24/21.  Pt had pulmonary embolisms and a-fib.  Pt did inpatient rehab for 1.5-2 weeks following.  Pt was able to get off oxygen and exercise at that time. 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 No    PRECAUTIONS: Other: heart  WEIGHT BEARING RESTRICTIONS No  FALLS:  Has patient fallen in last 6 months? No but I know I'm not steady on my feet  LIVING ENVIRONMENT: Lives with: lives with their spouse  Lives in: House/apartment Stairs: Yes: Internal: 16 steps; on right going up - Pt does not use, lives on main level External: 6 railing on right Has following equipment at home: None, was told in past he should use a cane but "it doesn't do anything for me", I have a walker and used it in the past after rehab  OCCUPATION: retired  PLOF: Independent  PATIENT GOALS walk better with more confidence and balance   OBJECTIVE:   DIAGNOSTIC FINDINGS:  N/A   COGNITION:  Overall cognitive status: Within functional limits for tasks assessed     SENSATION: WFL  MUSCLE LENGTH: Hamstrings: Right 45 deg; Left 45 deg Limited gluteals and piriformis by 60% bil  POSTURE: decreased lumbar lordosis and flexed trunk   PALPATION: Bil LE edema observed  LUMBAR ROM:   Active  A/PROM  eval  Flexion 30, bends knees  Extension 5  Right lateral flexion 10  Left lateral flexion 10  Right rotation 30%  Left rotation 30%   (Blank rows = not tested)  LOWER EXTREMITY ROM:     Hip ROM grossly limited 50-75% bil  LOWER EXTREMITY MMT:     Grossly 4+/5 bil LE    FUNCTIONAL TESTS:  5 times sit to stand: 17 sec with hands Timed up and go (TUG): 11 sec Berg Balance Scale: 40/56 Dynamic Gait Index: 12/24  GAIT: Distance walked: within clinic Assistive device utilized:  None Level of assistance: Complete Independence Comments: lacks trunk rotation, wide BOS, flexed trunk    TODAY'S TREATMENT  12/14/21: Initiated HEP, see below   PATIENT EDUCATION:  Education details: Access Code: [email or web address] Person educated: Patient Education method: Explanation, Demonstration, and Handouts Education comprehension: verbalized understanding and returned demonstration   HOME EXERCISE PROGRAM: Access Code: [email or web address] URL: [email or web address] Date: 12/14/2021 Prepared by: Loistine Simas Gene Glazebrook  Exercises - Seated Hamstring Stretch  - 3 x daily - 7 x weekly - 1 sets - 3 reps - 30 hold - Hooklying Single Knee to Chest Stretch  - 1 x daily - 7 x weekly - 2 sets - 10 reps - Supine Lower Trunk Rotation  - 1 x daily - 7 x weekly - 2 sets - 10 reps - Heel Raises with Counter Support  - 1 x daily - 7 x weekly - 2 sets - 10 reps - Seated Heel Toe Raises  - 3 x daily - 7 x weekly - 1 sets - 20 reps  ASSESSMENT:  CLINICAL IMPRESSION: Patient is a 76 y.o. male who was seen today for physical therapy evaluation and treatment for decreased mobility, strength and endurance following long hospital stay due to post-op complications following a ventral hernia repair in late 2022.  Pt developed a-fib requiring cardioversion.  Pt has limited spinal ROM, LE flexibility, gait dysfuction (ind gait w/o AD but lacks rotation, uses wide BOS and trunk flexed), and decreased static and dynamic balance.  He scores 40/56 on BERG and 12/24 on DGI.  Pt will benefit from skilled PT to address findings.  He reports he does not want more than 15 min of HEP daily.     OBJECTIVE IMPAIRMENTS Abnormal gait, decreased activity tolerance, decreased balance, decreased coordination, decreased mobility, decreased ROM, decreased strength, hypomobility, impaired flexibility, and postural dysfunction.   ACTIVITY LIMITATIONS bending, squatting, stairs, transfers, bed mobility, and locomotion  level  PARTICIPATION LIMITATIONS: cleaning, driving, shopping, community activity, and yard work  PERSONAL FACTORS Age and 1-2 comorbidities: 3x abdominal hernia repair, Hx of a-fib with cardioversion, bil  LE edema, Hx of pulmonary embolis  are also affecting patient's functional outcome.   REHAB POTENTIAL: Good  CLINICAL DECISION MAKING: Stable/uncomplicated  EVALUATION COMPLEXITY: Low   GOALS: Goals reviewed with patient? Yes  SHORT TERM GOALS: Target date: 01/11/2022  Pt will be ind with HEP  Baseline: Goal status: INITIAL  2.  Pt will improve LE flexibility to [facility] to allow for greater ease with bending and bed mobility Baseline:  Goal status: INITIAL    LONG TERM GOALS: Target date: 02/08/22  Pt will be ind with advanced HEP and understand importance of compliance Baseline:  Goal status: INITIAL  2.  Pt will improve BERG balance test to at least 48/56 to demo improved safety Baseline:  Goal status: INITIAL  3.  Pt will improve DGI to at least 18/24 to demo improved safety with dynamic gait tasks. Baseline:  Goal status: INITIAL  4.  Pt will improve 5x sit to stand to </= 15 sec without use of hands to demo reduced fall risk. Baseline:  Goal status: INITIAL     PLAN: PT FREQUENCY: 1-2x/week  PT DURATION: 8 weeks  PLANNED INTERVENTIONS: Therapeutic exercises, Therapeutic activity, Neuromuscular re-education, Balance training, Gait training, Patient/Family education, Joint mobilization, Electrical stimulation, Spinal mobilization, and Manual therapy.  PLAN FOR NEXT SESSION: progress general mobility (spine ROM and stretches as tol) and functional strength and balance tasks, Pt has prior HEP from rehab he may bring to review, Pt needs HEP streamlined to 15 min daily for compliance (Per his report)   Morton Peters, PT 12/14/21 1:51 PM

## 2021-12-15 NOTE — Telephone Encounter (Signed)
Spoke with patient is doubling up already and will wait for mail order.

## 2021-12-22 ENCOUNTER — Ambulatory Visit: Payer: Medicare Other | Admitting: Physical Therapy

## 2021-12-22 ENCOUNTER — Encounter: Payer: Self-pay | Admitting: Physical Therapy

## 2021-12-22 DIAGNOSIS — R2689 Other abnormalities of gait and mobility: Secondary | ICD-10-CM | POA: Diagnosis not present

## 2021-12-22 DIAGNOSIS — M6281 Muscle weakness (generalized): Secondary | ICD-10-CM

## 2021-12-22 NOTE — Therapy (Signed)
OUTPATIENT PHYSICAL THERAPY TREATMENT NOTE   Patient Name: Omar Chen MRN: [id number] DOB:1945/06/16, 76 y.o., male Today's Date: 12/22/2021  REFERRING PROVIDER: Emilio Aspen, MD   END OF SESSION:   PT End of Session - 12/22/21 1112     Visit Number 2    Date for PT Re-Evaluation 02/08/22    Authorization Type Medicare Part A/B, KX at visit 15    Progress Note Due on Visit 10    PT Start Time 1107    PT Stop Time 1147    PT Time Calculation (min) 40 min    Activity Tolerance Patient tolerated treatment well    Behavior During Therapy [facility] for tasks assessed/performed             Past Medical History:  Diagnosis Date   Arrhythmia    Atrial fib post surgery Dec 2022   Chronic kidney disease    Diabetes mellitus without complication ([facility])    Hyperlipidemia    Hypertension    History reviewed. No pertinent surgical history. There are no problems to display for this patient.   REFERRING DIAG: R26.89 (ICD-10-CM) - Other abnormalities of gait and mobility   THERAPY DIAG:  Other abnormalities of gait and mobility  Muscle weakness (generalized)  Rationale for Evaluation and Treatment Rehabilitation  PERTINENT HISTORY: on anticoagulation, ventral hernia repair developed postoperative complications needing long stay in the hospital and also developed paroxysmal atrial fibrillation underwent cardioversion  ONSET DATE: following post-op complications with ventral hernia repair, needed long stay in hospital and developed a-fib, underwent cardioversion, lost mobility, strength, endurance    PRECAUTIONS: heart  SUBJECTIVE: I am stretching my back.  Was under the weather last 2 days so didn't do HEP those days.  PAIN:    Are you having pain?  No  OBJECTIVE:    DIAGNOSTIC FINDINGS:  N/A     COGNITION:           Overall cognitive status: Within functional limits for tasks assessed                          SENSATION: WFL   MUSCLE LENGTH: Hamstrings:  Right 45 deg; Left 45 deg Limited gluteals and piriformis by 60% bil   POSTURE: decreased lumbar lordosis and flexed trunk    PALPATION: Bil LE edema observed   LUMBAR ROM:    Active  A/PROM  eval  Flexion 30, bends knees  Extension 5  Right lateral flexion 10  Left lateral flexion 10  Right rotation 30%  Left rotation 30%   (Blank rows = not tested)   LOWER EXTREMITY ROM:      Hip ROM grossly limited 50-75% bil   LOWER EXTREMITY MMT:                Grossly 4+/5 bil LE       FUNCTIONAL TESTS:  5 times sit to stand: 17 sec with hands Timed up and go (TUG): 11 sec Berg Balance Scale: 40/56 Dynamic Gait Index: 12/24   GAIT: Distance walked: within clinic Assistive device utilized: None Level of assistance: Complete Independence Comments: lacks trunk rotation, wide BOS, flexed trunk       TODAY'S TREATMENT  12/22/21: NuStep x 4', L4 seat 13, arms 13, PT present to monitor At barre bil UE support: High knee march x 20, SLS 3x10 Rt/Lt, bil heel raise x20 Sit to stand from chair 2x 5 Tandem stance 1x30" single UE support, then  tandem fwd walk along barre x 3 laps Standing bil row green x 20 Walking in hallway: head turns Lt/Rt x 2 laps down and back, close supervision Step over hurdles along barre x 5 laps (Pt tends to circumduct and catch toe) Seated ankle DF x 20 Rockerboard 2' ant/post bil UE support  12/14/21: Initiated HEP, see below     PATIENT EDUCATION:  Education details: Access Code: [email or web address] Person educated: Patient Education method: Consulting civil engineer, Demonstration, and Handouts Education comprehension: verbalized understanding and returned demonstration     HOME EXERCISE PROGRAM: Access Code: [email or web address] URL: [email or web address] Date: 12/22/2021 Prepared by: Venetia Night Alyssa Rotondo  Exercises - Seated Hamstring Stretch  - 3 x daily - 7 x weekly - 1 sets - 3 reps - 30 hold - Hooklying Single Knee to Chest Stretch  - 1 x daily - 7 x weekly -  2 sets - 10 reps - Supine Lower Trunk Rotation  - 1 x daily - 7 x weekly - 2 sets - 10 reps - Heel Raises with Counter Support  - 1 x daily - 7 x weekly - 2 sets - 10 reps - Seated Heel Toe Raises  - 3 x daily - 7 x weekly - 1 sets - 20 reps - Standing March with Counter Support  - 1 x daily - 7 x weekly - 3 sets - 10 reps - Standing March with Counter Support  - 1 x daily - 7 x weekly - 2 sets - 20 reps - Standing Hip Abduction with Counter Support  - 1 x daily - 7 x weekly - 3 sets - 10 reps - Standing Hip Extension with Counter Support  - 1 x daily - 7 x weekly - 3 sets - 10 reps - Seated Long Arc Quad  - 1 x daily - 7 x weekly - 2 sets - 10 reps - Sit to Stand  - 1 x daily - 7 x weekly - 3 sets - 5 reps ASSESSMENT:   CLINICAL IMPRESSION: Pt arrived ready to participate in PT.  He has challenge in narrow BOS and in SLS so focus on standing marching, tandem stance, tandem gait, step overs performed alt with seated therex.  Pt demo'd gait drift with gait with head turns but this improved with repeated practice today.  Pt demo'ing improved trunk rotation and arm swing with gait since starting his back stretches.  HEP updated today.     OBJECTIVE IMPAIRMENTS Abnormal gait, decreased activity tolerance, decreased balance, decreased coordination, decreased mobility, decreased ROM, decreased strength, hypomobility, impaired flexibility, and postural dysfunction.    ACTIVITY LIMITATIONS bending, squatting, stairs, transfers, bed mobility, and locomotion level   PARTICIPATION LIMITATIONS: cleaning, driving, shopping, community activity, and yard work   [place] Age and 1-2 comorbidities: 3x abdominal hernia repair, Hx of a-fib with cardioversion, bil LE edema, Hx of pulmonary embolis  are also affecting patient's functional outcome.    REHAB POTENTIAL: Good   CLINICAL DECISION MAKING: Stable/uncomplicated   EVALUATION COMPLEXITY: Low     GOALS: Goals reviewed with patient? Yes    SHORT TERM GOALS: Target date: 01/11/2022   Pt will be ind with HEP  Baseline: Goal status: in progress   2.  Pt will improve LE flexibility to [facility] to allow for greater ease with bending and bed mobility Baseline:  Goal status: INITIAL       LONG TERM GOALS: Target date: 02/08/22   Pt will be ind with advanced HEP and understand importance of compliance  Baseline:  Goal status: INITIAL   2.  Pt will improve BERG balance test to at least 48/56 to demo improved safety Baseline:  Goal status: INITIAL   3.  Pt will improve DGI to at least 18/24 to demo improved safety with dynamic gait tasks. Baseline:  Goal status: INITIAL   4.  Pt will improve 5x sit to stand to </= 15 sec without use of hands to demo reduced fall risk. Baseline:  Goal status: INITIAL         PLAN: PT FREQUENCY: 1-2x/week   PT DURATION: 8 weeks   PLANNED INTERVENTIONS: Therapeutic exercises, Therapeutic activity, Neuromuscular re-education, Balance training, Gait training, Patient/Family education, Joint mobilization, Electrical stimulation, Spinal mobilization, and Manual therapy.   PLAN FOR NEXT SESSION: progress general mobility (spine ROM and stretches as tol) and functional strength and balance tasks, Pt has prior HEP from rehab he may bring to review, Pt needs HEP streamlined to 15 min daily for compliance (Per his report)       Morton Peters, PT 12/22/21 11:58 AM

## 2021-12-29 ENCOUNTER — Ambulatory Visit: Payer: Medicare Other | Admitting: Cardiology

## 2021-12-29 NOTE — Progress Notes (Signed)
EKG 03/26/2021: Atrial fibrillation with controlled ventricular response at rate of 89 bpm, normal axis, nonspecific T abnormality, cannot exclude lateral ischemia.  Abnormal EKG.

## 2021-12-30 ENCOUNTER — Encounter: Payer: Self-pay | Admitting: Physical Therapy

## 2021-12-30 ENCOUNTER — Ambulatory Visit: Payer: Medicare Other | Admitting: Physical Therapy

## 2021-12-30 DIAGNOSIS — M6281 Muscle weakness (generalized): Secondary | ICD-10-CM

## 2021-12-30 DIAGNOSIS — R2689 Other abnormalities of gait and mobility: Secondary | ICD-10-CM | POA: Diagnosis not present

## 2021-12-30 NOTE — Therapy (Signed)
OUTPATIENT PHYSICAL THERAPY TREATMENT NOTE   Patient Name: Blaine Hari MRN: [id number] DOB:July 28, 1945, 76 y.o., male Today's Date: 12/30/2021  REFERRING PROVIDER: Emilio Aspen, MD   END OF SESSION:   PT End of Session - 12/30/21 1105     Visit Number 3    Date for PT Re-Evaluation 02/08/22    Authorization Type Medicare Part A/B, KX at visit 15    Progress Note Due on Visit 10    PT Start Time 1101    PT Stop Time 1140    PT Time Calculation (min) 39 min    Activity Tolerance Patient tolerated treatment well    Behavior During Therapy [facility] for tasks assessed/performed              Past Medical History:  Diagnosis Date   Arrhythmia    Atrial fib post surgery Dec 2022   Chronic kidney disease    Diabetes mellitus without complication ([facility])    Hyperlipidemia    Hypertension    History reviewed. No pertinent surgical history. There are no problems to display for this patient.   REFERRING DIAG: R26.89 (ICD-10-CM) - Other abnormalities of gait and mobility   THERAPY DIAG:  Other abnormalities of gait and mobility  Muscle weakness (generalized)  Rationale for Evaluation and Treatment Rehabilitation  PERTINENT HISTORY: on anticoagulation, ventral hernia repair developed postoperative complications needing long stay in the hospital and also developed paroxysmal atrial fibrillation underwent cardioversion  ONSET DATE: following post-op complications with ventral hernia repair, needed long stay in hospital and developed a-fib, underwent cardioversion, lost mobility, strength, endurance    PRECAUTIONS: heart  SUBJECTIVE: I brought my old exercises   PAIN:    Are you having pain?  No  OBJECTIVE:    DIAGNOSTIC FINDINGS:  N/A     COGNITION:           Overall cognitive status: Within functional limits for tasks assessed                          SENSATION: WFL   MUSCLE LENGTH: Hamstrings: Right 45 deg; Left 45 deg Limited gluteals and piriformis  by 60% bil   POSTURE: decreased lumbar lordosis and flexed trunk    PALPATION: Bil LE edema observed   LUMBAR ROM:    Active  A/PROM  eval  Flexion 30, bends knees  Extension 5  Right lateral flexion 10  Left lateral flexion 10  Right rotation 30%  Left rotation 30%   (Blank rows = not tested)   LOWER EXTREMITY ROM:      Hip ROM grossly limited 50-75% bil   LOWER EXTREMITY MMT:                Grossly 4+/5 bil LE       FUNCTIONAL TESTS:  5 times sit to stand: 17 sec with hands Timed up and go (TUG): 11 sec Berg Balance Scale: 40/56 Dynamic Gait Index: 12/24   GAIT: Distance walked: within clinic Assistive device utilized: None Level of assistance: Complete Independence Comments: lacks trunk rotation, wide BOS, flexed trunk       TODAY'S TREATMENT  12/30/21: NuStep L4 x 5' seat 12/arms 12 PT present to monitor Standing bwd resistance walking 10lb x 5, supervision Seated 2x10 each, bil:  heel toe raises, LAQ, march alt LE SLS 3x10 Rt/Lt single UE support Tandem stance 2x30" single light UE support to intermittent no UE support Standing bil row blue x 20,  bil shoulder extension red x 20 (added to HEP) Sit to stand from chair x 5 with UE support High knee walk single UE support along barre x 2 laps down and back  12/22/21: NuStep x 4', L4 seat 13, arms 13, PT present to monitor At barre bil UE support: High knee march x 20, SLS 3x10 Rt/Lt, bil heel raise x20 Sit to stand from chair 2x 5 Tandem stance 1x30" single UE support, then tandem fwd walk along barre x 3 laps Standing bil row green x 20 Walking in hallway: head turns Lt/Rt x 2 laps down and back, close supervision Step over hurdles along barre x 5 laps (Pt tends to circumduct and catch toe) Seated ankle DF x 20 Rockerboard 2' ant/post bil UE support  12/14/21: Initiated HEP, see below     PATIENT EDUCATION:  Education details: Access Code: [email or web address] Person educated: Patient Education method:  Programmer, multimedia, Demonstration, and Handouts Education comprehension: verbalized understanding and returned demonstration     HOME EXERCISE PROGRAM: Access Code: [email or web address] URL: [email or web address] Date: 12/30/2021 Prepared by: Loistine Simas Janeva Peaster  Exercises - Seated Hamstring Stretch  - 3 x daily - 7 x weekly - 1 sets - 3 reps - 30 hold - Hooklying Single Knee to Chest Stretch  - 1 x daily - 7 x weekly - 2 sets - 10 reps - Supine Lower Trunk Rotation  - 1 x daily - 7 x weekly - 2 sets - 10 reps - Heel Raises with Counter Support  - 1 x daily - 7 x weekly - 2 sets - 10 reps - Seated Heel Toe Raises  - 3 x daily - 7 x weekly - 1 sets - 20 reps - Standing March with Counter Support  - 1 x daily - 7 x weekly - 3 sets - 10 reps - Standing March with Counter Support  - 1 x daily - 7 x weekly - 2 sets - 20 reps - Standing Hip Abduction with Counter Support  - 1 x daily - 7 x weekly - 3 sets - 10 reps - Standing Hip Extension with Counter Support  - 1 x daily - 7 x weekly - 3 sets - 10 reps - Seated Long Arc Quad  - 1 x daily - 7 x weekly - 2 sets - 10 reps - Sit to Stand  - 1 x daily - 7 x weekly - 3 sets - 5 reps - Standing Row with Anchored Resistance  - 1 x daily - 7 x weekly - 1 sets - 20 reps - Standing Shoulder Extension with Resistance  - 1 x daily - 7 x weekly - 1 sets - 20 reps  ASSESSMENT:   CLINICAL IMPRESSION: Pt tolerated progression of therex well today.  He demo'd improved SLS and tandem stance stability today, at times performing with intermittent periods of no UE support (brief).  PT added UE tband to standing HEP today for trunk and UE postural strength.  Good participation and work rate.     OBJECTIVE IMPAIRMENTS Abnormal gait, decreased activity tolerance, decreased balance, decreased coordination, decreased mobility, decreased ROM, decreased strength, hypomobility, impaired flexibility, and postural dysfunction.    ACTIVITY LIMITATIONS bending, squatting,  stairs, transfers, bed mobility, and locomotion level   PARTICIPATION LIMITATIONS: cleaning, driving, shopping, community activity, and yard work   PERSONAL FACTORS Age and 1-2 comorbidities: 3x abdominal hernia repair, Hx of a-fib with cardioversion, bil LE edema, Hx of pulmonary embolis  are also affecting patient's functional outcome.  REHAB POTENTIAL: Good   CLINICAL DECISION MAKING: Stable/uncomplicated   EVALUATION COMPLEXITY: Low     GOALS: Goals reviewed with patient? Yes   SHORT TERM GOALS: Target date: 01/11/2022   Pt will be ind with HEP  Baseline: Goal status: in progress   2.  Pt will improve LE flexibility to [facility] to allow for greater ease with bending and bed mobility Baseline:  Goal status: INITIAL       LONG TERM GOALS: Target date: 02/08/22   Pt will be ind with advanced HEP and understand importance of compliance Baseline:  Goal status: INITIAL   2.  Pt will improve BERG balance test to at least 48/56 to demo improved safety Baseline:  Goal status: INITIAL   3.  Pt will improve DGI to at least 18/24 to demo improved safety with dynamic gait tasks. Baseline:  Goal status: INITIAL   4.  Pt will improve 5x sit to stand to </= 15 sec without use of hands to demo reduced fall risk. Baseline:  Goal status: INITIAL         PLAN: PT FREQUENCY: 1-2x/week   PT DURATION: 8 weeks   PLANNED INTERVENTIONS: Therapeutic exercises, Therapeutic activity, Neuromuscular re-education, Balance training, Gait training, Patient/Family education, Joint mobilization, Electrical stimulation, Spinal mobilization, and Manual therapy.   PLAN FOR NEXT SESSION: progress general mobility (spine ROM and stretches as tol) and functional strength and balance tasks, Pt has prior HEP from rehab he may bring to review, Pt needs HEP streamlined to 15 min daily for compliance (Per his report)       Morton Peters, PT 12/30/21 11:43 AM

## 2022-01-04 ENCOUNTER — Ambulatory Visit: Payer: Medicare Other | Attending: Internal Medicine | Admitting: Physical Therapy

## 2022-01-04 ENCOUNTER — Encounter: Payer: Self-pay | Admitting: Physical Therapy

## 2022-01-04 DIAGNOSIS — M6281 Muscle weakness (generalized): Secondary | ICD-10-CM | POA: Diagnosis present

## 2022-01-04 DIAGNOSIS — R2689 Other abnormalities of gait and mobility: Secondary | ICD-10-CM | POA: Insufficient documentation

## 2022-01-04 NOTE — Therapy (Signed)
OUTPATIENT PHYSICAL THERAPY TREATMENT NOTE   Patient Name: Omar Chen MRN: [id number] DOB:[birth year], 76 y.o., male Today's Date: 01/04/2022  REFERRING PROVIDER: Kathalene Frames, MD   END OF SESSION:   PT End of Session - 01/04/22 1219     Visit Number 4    Date for PT Re-Evaluation 02/08/22    Authorization Type Medicare Part A/B, KX at visit 15    Progress Note Due on Visit 10    PT Start Time 1220    PT Stop Time 1255    PT Time Calculation (min) 35 min    Activity Tolerance Patient tolerated treatment well    Behavior During Therapy [facility] for tasks assessed/performed               Past Medical History:  Diagnosis Date   Arrhythmia    Atrial fib post surgery Dec 2022   Chronic kidney disease    Diabetes mellitus without complication ([place])    Hyperlipidemia    Hypertension    History reviewed. No pertinent surgical history. There are no problems to display for this patient.   REFERRING DIAG: R26.89 (ICD-10-CM) - Other abnormalities of gait and mobility   THERAPY DIAG:  Other abnormalities of gait and mobility  Muscle weakness (generalized)  Rationale for Evaluation and Treatment Rehabilitation  PERTINENT HISTORY: on anticoagulation, ventral hernia repair developed postoperative complications needing long stay in the hospital and also developed paroxysmal atrial fibrillation underwent cardioversion  ONSET DATE: following post-op complications with ventral hernia repair, needed long stay in hospital and developed a-fib, underwent cardioversion, lost mobility, strength, endurance    PRECAUTIONS: heart  SUBJECTIVE:  I need to review the band exercises.  I may need to take things more easily today;  I am a little dizzy.  PAIN:    Are you having pain?  No  OBJECTIVE:    DIAGNOSTIC FINDINGS:  N/A     COGNITION:           Overall cognitive status: Within functional limits for tasks assessed                          SENSATION: WFL   MUSCLE  LENGTH: Hamstrings: Right 45 deg; Left 45 deg Limited gluteals and piriformis by 60% bil   POSTURE: decreased lumbar lordosis and flexed trunk    PALPATION: Bil LE edema observed   LUMBAR ROM:    Active  A/PROM  eval  Flexion 30, bends knees  Extension 5  Right lateral flexion 10  Left lateral flexion 10  Right rotation 30%  Left rotation 30%   (Blank rows = not tested)   LOWER EXTREMITY ROM:      Hip ROM grossly limited 50-75% bil   LOWER EXTREMITY MMT:                Grossly 4+/5 bil LE       FUNCTIONAL TESTS:  5 times sit to stand: 17 sec with hands Timed up and go (TUG): 11 sec Berg Balance Scale: 40/56 Dynamic Gait Index: 12/24   GAIT: Distance walked: within clinic Assistive device utilized: None Level of assistance: Complete Independence Comments: lacks trunk rotation, wide BOS, flexed trunk       TODAY'S TREATMENT  01/04/22: NuStep L4 x 5' seat 12/arms 12 PT present to monitor Seated 2x10 each, bil:  heel toe raises, LAQ 2lb, march 5lb kbell on thigh SLS 3x10 Rt/Lt single UE support Tandem stance 1x30" single  light UE support to intermittent no UE support Standing red band bil shoulder ext x 20 Standing blue band bil shoulder row x 20 High knee walk single UE support along barre x 4 laps down and back  12/30/21: NuStep L4 x 5' seat 12/arms 12 PT present to monitor Standing bwd resistance walking 10lb x 5, supervision Seated 2x10 each, bil:  heel toe raises, LAQ, march alt LE SLS 3x10 Rt/Lt single UE support Tandem stance 2x30" single light UE support to intermittent no UE support Standing bil row blue x 20, bil shoulder extension red x 20 (added to HEP) Sit to stand from chair x 5 with UE support High knee walk single UE support along barre x 2 laps down and back  12/22/21: NuStep x 4', L4 seat 13, arms 13, PT present to monitor At barre bil UE support: High knee march x 20, SLS 3x10 Rt/Lt, bil heel raise x20 Sit to stand from chair 2x 5 Tandem  stance 1x30" single UE support, then tandem fwd walk along barre x 3 laps Standing bil row green x 20 Walking in hallway: head turns Lt/Rt x 2 laps down and back, close supervision Step over hurdles along barre x 5 laps (Pt tends to circumduct and catch toe) Seated ankle DF x 20 Rockerboard 2' ant/post bil UE support     PATIENT EDUCATION:  Education details: Access Code: [email or web address] Person educated: Patient Education method: Explanation, Demonstration, and Handouts Education comprehension: verbalized understanding and returned demonstration     HOME EXERCISE PROGRAM: Access Code: [email or web address] URL: [email or web address] Date: 12/30/2021 Prepared by: Venetia Night Brin Ruggerio  Exercises - Seated Hamstring Stretch  - 3 x daily - 7 x weekly - 1 sets - 3 reps - 30 hold - Hooklying Single Knee to Chest Stretch  - 1 x daily - 7 x weekly - 2 sets - 10 reps - Supine Lower Trunk Rotation  - 1 x daily - 7 x weekly - 2 sets - 10 reps - Heel Raises with Counter Support  - 1 x daily - 7 x weekly - 2 sets - 10 reps - Seated Heel Toe Raises  - 3 x daily - 7 x weekly - 1 sets - 20 reps - Standing March with Counter Support  - 1 x daily - 7 x weekly - 3 sets - 10 reps - Standing March with Counter Support  - 1 x daily - 7 x weekly - 2 sets - 20 reps - Standing Hip Abduction with Counter Support  - 1 x daily - 7 x weekly - 3 sets - 10 reps - Standing Hip Extension with Counter Support  - 1 x daily - 7 x weekly - 3 sets - 10 reps - Seated Long Arc Quad  - 1 x daily - 7 x weekly - 2 sets - 10 reps - Sit to Stand  - 1 x daily - 7 x weekly - 3 sets - 5 reps - Standing Row with Anchored Resistance  - 1 x daily - 7 x weekly - 1 sets - 20 reps - Standing Shoulder Extension with Resistance  - 1 x daily - 7 x weekly - 1 sets - 20 reps  ASSESSMENT:   CLINICAL IMPRESSION: Pt arrived with report of dizziness since awaking this AM.  He was able to participate in PT with some improvement in dizziness with  NuStep and seated therex.  Standing therex kept to minimum as Pt did not feel as steady today.  He demo'd  proper form with UE bands but needed cueing for more scapular activation with row and shoulder ext.  He is unable to balance in SLS without UE support on either LE although he can perform intermittent balance in tandem without UE support for up to 5-6 sec.  Pt with improved gait pattern demo'ing improved arm swing, trunk rotation and upright trunk.  Continue along POC.     OBJECTIVE IMPAIRMENTS Abnormal gait, decreased activity tolerance, decreased balance, decreased coordination, decreased mobility, decreased ROM, decreased strength, hypomobility, impaired flexibility, and postural dysfunction.    ACTIVITY LIMITATIONS bending, squatting, stairs, transfers, bed mobility, and locomotion level   PARTICIPATION LIMITATIONS: cleaning, driving, shopping, community activity, and yard work   [place] Age and 1-2 comorbidities: 3x abdominal hernia repair, Hx of a-fib with cardioversion, bil LE edema, Hx of pulmonary embolis  are also affecting patient's functional outcome.    REHAB POTENTIAL: Good   CLINICAL DECISION MAKING: Stable/uncomplicated   EVALUATION COMPLEXITY: Low     GOALS: Goals reviewed with patient? Yes   SHORT TERM GOALS: Target date: 01/11/2022   Pt will be ind with HEP  Baseline: Goal status: MET   2.  Pt will improve LE flexibility to [facility] to allow for greater ease with bending and bed mobility Baseline:  Goal status: ONGOING       LONG TERM GOALS: Target date: 02/08/22   Pt will be ind with advanced HEP and understand importance of compliance Baseline:  Goal status: ONGOING   2.  Pt will improve BERG balance test to at least 48/56 to demo improved safety Baseline:  Goal status: INITIAL   3.  Pt will improve DGI to at least 18/24 to demo improved safety with dynamic gait tasks. Baseline:  Goal status: INITIAL   4.  Pt will improve 5x sit to stand to </=  15 sec without use of hands to demo reduced fall risk. Baseline:  Goal status: INITIAL         PLAN: PT FREQUENCY: 1-2x/week   PT DURATION: 8 weeks   PLANNED INTERVENTIONS: Therapeutic exercises, Therapeutic activity, Neuromuscular re-education, Balance training, Gait training, Patient/Family education, Joint mobilization, Electrical stimulation, Spinal mobilization, and Manual therapy.   PLAN FOR NEXT SESSION: update 5x sit to stand and DGI measures, progress general mobility (spine ROM and stretches as tol) and functional strength and balance tasks, Pt has prior HEP from rehab he may bring to review, Pt needs HEP streamlined to 15 min daily for compliance (Per his report)       Baruch Merl, PT 01/04/22 1:06 PM

## 2022-01-18 ENCOUNTER — Ambulatory Visit: Payer: Medicare Other | Admitting: Physical Therapy

## 2022-01-25 ENCOUNTER — Other Ambulatory Visit: Payer: Self-pay | Admitting: Internal Medicine

## 2022-01-25 ENCOUNTER — Ambulatory Visit: Payer: Medicare Other | Admitting: Physical Therapy

## 2022-01-25 DIAGNOSIS — L03311 Cellulitis of abdominal wall: Secondary | ICD-10-CM

## 2022-01-25 NOTE — Therapy (Deleted)
OUTPATIENT PHYSICAL THERAPY TREATMENT NOTE   Patient Name: Omar Chen MRN: [id number] DOB:27-Jul-1945, 76 y.o., male Today's Date: 01/25/2022  REFERRING PROVIDER: Kathalene Frames, MD   END OF SESSION:       Past Medical History:  Diagnosis Date   Arrhythmia    Atrial fib post surgery Dec 2022   Chronic kidney disease    Diabetes mellitus without complication ([place])    Hyperlipidemia    Hypertension    No past surgical history on file. There are no problems to display for this patient.   REFERRING DIAG: R26.89 (ICD-10-CM) - Other abnormalities of gait and mobility   THERAPY DIAG:  No diagnosis found.  Rationale for Evaluation and Treatment Rehabilitation  PERTINENT HISTORY: on anticoagulation, ventral hernia repair developed postoperative complications needing long stay in the hospital and also developed paroxysmal atrial fibrillation underwent cardioversion  ONSET DATE: following post-op complications with ventral hernia repair, needed long stay in hospital and developed a-fib, underwent cardioversion, lost mobility, strength, endurance    PRECAUTIONS: heart  SUBJECTIVE:  I need to review the band exercises.  I may need to take things more easily today;  I am a little dizzy.  PAIN:    Are you having pain?  No  OBJECTIVE:    DIAGNOSTIC FINDINGS:  N/A     COGNITION:           Overall cognitive status: Within functional limits for tasks assessed                          SENSATION: WFL   MUSCLE LENGTH: Hamstrings: Right 45 deg; Left 45 deg Limited gluteals and piriformis by 60% bil   POSTURE: decreased lumbar lordosis and flexed trunk    PALPATION: Bil LE edema observed   LUMBAR ROM:    Active  A/PROM  eval  Flexion 30, bends knees  Extension 5  Right lateral flexion 10  Left lateral flexion 10  Right rotation 30%  Left rotation 30%   (Blank rows = not tested)   LOWER EXTREMITY ROM:      Hip ROM grossly limited 50-75% bil    LOWER EXTREMITY MMT:                Grossly 4+/5 bil LE       FUNCTIONAL TESTS:  5 times sit to stand: 17 sec with hands Timed up and go (TUG): 11 sec Berg Balance Scale: 40/56 Dynamic Gait Index: 12/24   GAIT: Distance walked: within clinic Assistive device utilized: None Level of assistance: Complete Independence Comments: lacks trunk rotation, wide BOS, flexed trunk       TODAY'S TREATMENT  01/18/22:    01/04/22: NuStep L4 x 5' seat 12/arms 12 PT present to monitor Seated 2x10 each, bil:  heel toe raises, LAQ 2lb, march 5lb kbell on thigh SLS 3x10 Rt/Lt single UE support Tandem stance 1x30" single light UE support to intermittent no UE support Standing red band bil shoulder ext x 20 Standing blue band bil shoulder row x 20 High knee walk single UE support along barre x 4 laps down and back  12/30/21: NuStep L4 x 5' seat 12/arms 12 PT present to monitor Standing bwd resistance walking 10lb x 5, supervision Seated 2x10 each, bil:  heel toe raises, LAQ, march alt LE SLS 3x10 Rt/Lt single UE support Tandem stance 2x30" single light UE support to intermittent no UE support Standing bil row blue x 20, bil shoulder extension  red x 20 (added to HEP) Sit to stand from chair x 5 with UE support High knee walk single UE support along barre x 2 laps down and back     PATIENT EDUCATION:  Education details: Access Code: [email or web address] Person educated: Patient Education method: Explanation, Demonstration, and Handouts Education comprehension: verbalized understanding and returned demonstration     HOME EXERCISE PROGRAM: Access Code: [email or web address] URL: [email or web address] Date: 12/30/2021 Prepared by: Venetia Night Nino Amano  Exercises - Seated Hamstring Stretch  - 3 x daily - 7 x weekly - 1 sets - 3 reps - 30 hold - Hooklying Single Knee to Chest Stretch  - 1 x daily - 7 x weekly - 2 sets - 10 reps - Supine Lower Trunk Rotation  - 1 x daily - 7 x weekly - 2 sets - 10  reps - Heel Raises with Counter Support  - 1 x daily - 7 x weekly - 2 sets - 10 reps - Seated Heel Toe Raises  - 3 x daily - 7 x weekly - 1 sets - 20 reps - Standing March with Counter Support  - 1 x daily - 7 x weekly - 3 sets - 10 reps - Standing March with Counter Support  - 1 x daily - 7 x weekly - 2 sets - 20 reps - Standing Hip Abduction with Counter Support  - 1 x daily - 7 x weekly - 3 sets - 10 reps - Standing Hip Extension with Counter Support  - 1 x daily - 7 x weekly - 3 sets - 10 reps - Seated Long Arc Quad  - 1 x daily - 7 x weekly - 2 sets - 10 reps - Sit to Stand  - 1 x daily - 7 x weekly - 3 sets - 5 reps - Standing Row with Anchored Resistance  - 1 x daily - 7 x weekly - 1 sets - 20 reps - Standing Shoulder Extension with Resistance  - 1 x daily - 7 x weekly - 1 sets - 20 reps  ASSESSMENT:   CLINICAL IMPRESSION: Pt arrived with report of dizziness since awaking this AM.  He was able to participate in PT with some improvement in dizziness with NuStep and seated therex.  Standing therex kept to minimum as Pt did not feel as steady today.  He demo'd proper form with UE bands but needed cueing for more scapular activation with row and shoulder ext.  He is unable to balance in SLS without UE support on either LE although he can perform intermittent balance in tandem without UE support for up to 5-6 sec.  Pt with improved gait pattern demo'ing improved arm swing, trunk rotation and upright trunk.  Continue along POC.     OBJECTIVE IMPAIRMENTS Abnormal gait, decreased activity tolerance, decreased balance, decreased coordination, decreased mobility, decreased ROM, decreased strength, hypomobility, impaired flexibility, and postural dysfunction.    ACTIVITY LIMITATIONS bending, squatting, stairs, transfers, bed mobility, and locomotion level   PARTICIPATION LIMITATIONS: cleaning, driving, shopping, community activity, and yard work   [place] Age and 1-2 comorbidities: 3x  abdominal hernia repair, Hx of a-fib with cardioversion, bil LE edema, Hx of pulmonary embolis  are also affecting patient's functional outcome.    REHAB POTENTIAL: Good   CLINICAL DECISION MAKING: Stable/uncomplicated   EVALUATION COMPLEXITY: Low     GOALS: Goals reviewed with patient? Yes   SHORT TERM GOALS: Target date: 01/11/2022   Pt will be ind with HEP  Baseline: Goal status:  MET   2.  Pt will improve LE flexibility to [facility] to allow for greater ease with bending and bed mobility Baseline:  Goal status: ONGOING       LONG TERM GOALS: Target date: 02/08/22   Pt will be ind with advanced HEP and understand importance of compliance Baseline:  Goal status: ONGOING   2.  Pt will improve BERG balance test to at least 48/56 to demo improved safety Baseline:  Goal status: INITIAL   3.  Pt will improve DGI to at least 18/24 to demo improved safety with dynamic gait tasks. Baseline:  Goal status: INITIAL   4.  Pt will improve 5x sit to stand to </= 15 sec without use of hands to demo reduced fall risk. Baseline:  Goal status: INITIAL         PLAN: PT FREQUENCY: 1-2x/week   PT DURATION: 8 weeks   PLANNED INTERVENTIONS: Therapeutic exercises, Therapeutic activity, Neuromuscular re-education, Balance training, Gait training, Patient/Family education, Joint mobilization, Electrical stimulation, Spinal mobilization, and Manual therapy.   PLAN FOR NEXT SESSION: update 5x sit to stand and DGI measures, progress general mobility (spine ROM and stretches as tol) and functional strength and balance tasks, Pt has prior HEP from rehab he may bring to review, Pt needs HEP streamlined to 15 min daily for compliance (Per his report)       Baruch Merl, PT 01/25/22 7:57 AM

## 2022-01-26 ENCOUNTER — Ambulatory Visit
Admission: RE | Admit: 2022-01-26 | Discharge: 2022-01-26 | Disposition: A | Discharge status: Home / Self Care | Payer: Medicare Other | Source: Ambulatory Visit | Attending: Internal Medicine | Admitting: Internal Medicine

## 2022-01-26 DIAGNOSIS — L03311 Cellulitis of abdominal wall: Secondary | ICD-10-CM

## 2022-02-01 ENCOUNTER — Encounter: Payer: Self-pay | Admitting: Physical Therapy

## 2022-02-01 ENCOUNTER — Ambulatory Visit: Payer: Medicare Other | Admitting: Physical Therapy

## 2022-02-01 DIAGNOSIS — M6281 Muscle weakness (generalized): Secondary | ICD-10-CM

## 2022-02-01 DIAGNOSIS — R2689 Other abnormalities of gait and mobility: Secondary | ICD-10-CM | POA: Diagnosis not present

## 2022-02-01 NOTE — Therapy (Signed)
OUTPATIENT PHYSICAL THERAPY TREATMENT NOTE   Patient Name: Omar Chen MRN: [id number] DOB:[birth year], 76 y.o., male Today's Date: 02/01/2022  REFERRING PROVIDER: Kathalene Frames, MD   END OF SESSION:   PT End of Session - 02/01/22 1231     Visit Number 5    Date for PT Re-Evaluation 02/08/22    Authorization Type Medicare Part A/B, KX at visit 15    Progress Note Due on Visit 10    PT Start Time 1231    PT Stop Time 1312    PT Time Calculation (min) 41 min    Activity Tolerance Patient tolerated treatment well    Behavior During Therapy [facility] for tasks assessed/performed                Past Medical History:  Diagnosis Date   Arrhythmia    Atrial fib post surgery Dec 2022   Chronic kidney disease    Diabetes mellitus without complication ([place])    Hyperlipidemia    Hypertension    History reviewed. No pertinent surgical history. There are no problems to display for this patient.   REFERRING DIAG: R26.89 (ICD-10-CM) - Other abnormalities of gait and mobility   THERAPY DIAG:  Other abnormalities of gait and mobility  Muscle weakness (generalized)  Rationale for Evaluation and Treatment Rehabilitation  PERTINENT HISTORY: on anticoagulation, ventral hernia repair developed postoperative complications needing long stay in the hospital and also developed paroxysmal atrial fibrillation underwent cardioversion  ONSET DATE: following post-op complications with ventral hernia repair, needed long stay in hospital and developed a-fib, underwent cardioversion, lost mobility, strength, endurance    PRECAUTIONS: heart  SUBJECTIVE:  I had a medical set back so missed a few weeks.   I have an infection in my old incision from abdominal surgery last year.  I am now on a medicine that makes me pretty weak so I am not sure I can do much today.  I will be on it another week.    PAIN:    Are you having pain?  No  OBJECTIVE:    DIAGNOSTIC FINDINGS:  N/A      COGNITION:           Overall cognitive status: Within functional limits for tasks assessed                          SENSATION: WFL   MUSCLE LENGTH: Hamstrings: Right 45 deg; Left 45 deg Limited gluteals and piriformis by 60% bil   POSTURE: decreased lumbar lordosis and flexed trunk    PALPATION: Bil LE edema observed   LUMBAR ROM:    Active  A/PROM  eval  Flexion 30, bends knees  Extension 5  Right lateral flexion 10  Left lateral flexion 10  Right rotation 30%  Left rotation 30%   (Blank rows = not tested)   LOWER EXTREMITY ROM:      Hip ROM grossly limited 50-75% bil   LOWER EXTREMITY MMT:                Grossly 4+/5 bil LE       FUNCTIONAL TESTS:  02/01/22: 5x sit to stand: 14 sec with hands   Evaluation:  5 times sit to stand: 17 sec with hands Timed up and go (TUG): 11 sec Berg Balance Scale: 40/56 Dynamic Gait Index: 12/24   GAIT: Distance walked: within clinic Assistive device utilized: None Level of assistance: Complete Independence Comments: lacks trunk rotation, wide  BOS, flexed trunk       TODAY'S TREATMENT  02/01/22: Standing at barre warm up: high knee march alt LE x 30, sidestepping along barre x 4 laps no UE support, bil heel raise x 10, SLS 3x10 each LE light single UE support 5x sit to stand (14 sec today) Tandem walk along barre x 4 laps, intermittent UE use Standing red band bil shoulder ext x 20 Standing blue band bil shoulder row x 20 Standing on airex pad march taps to 1st step x 10, to 2nd step x 10 Up/down 4 6" steps step-to pattern Tandem stance and narrow BOS single arm support: head turns Lt/Rt x10 each position Tandem stance and narrow BOS single arm support: eyes closed 10" each position   01/04/22: NuStep L4 x 5' seat 12/arms 12 PT present to monitor Seated 2x10 each, bil:  heel toe raises, LAQ 2lb, march 5lb kbell on thigh SLS 3x10 Rt/Lt single UE support Tandem stance 1x30" single light UE support to intermittent  no UE support Standing red band bil shoulder ext x 20 Standing blue band bil shoulder row x 20 High knee walk single UE support along barre x 4 laps down and back  12/30/21: NuStep L4 x 5' seat 12/arms 12 PT present to monitor Standing bwd resistance walking 10lb x 5, supervision Seated 2x10 each, bil:  heel toe raises, LAQ, march alt LE SLS 3x10 Rt/Lt single UE support Tandem stance 2x30" single light UE support to intermittent no UE support Standing bil row blue x 20, bil shoulder extension red x 20 (added to HEP) Sit to stand from chair x 5 with UE support High knee walk single UE support along barre x 2 laps down and back     PATIENT EDUCATION:  Education details: Access Code: [email or web address] Person educated: Patient Education method: Explanation, Demonstration, and Handouts Education comprehension: verbalized understanding and returned demonstration     HOME EXERCISE PROGRAM: Access Code: [email or web address] URL: [email or web address] Date: 12/30/2021 Prepared by: Venetia Night Lyman Balingit  Exercises - Seated Hamstring Stretch  - 3 x daily - 7 x weekly - 1 sets - 3 reps - 30 hold - Hooklying Single Knee to Chest Stretch  - 1 x daily - 7 x weekly - 2 sets - 10 reps - Supine Lower Trunk Rotation  - 1 x daily - 7 x weekly - 2 sets - 10 reps - Heel Raises with Counter Support  - 1 x daily - 7 x weekly - 2 sets - 10 reps - Seated Heel Toe Raises  - 3 x daily - 7 x weekly - 1 sets - 20 reps - Standing March with Counter Support  - 1 x daily - 7 x weekly - 3 sets - 10 reps - Standing March with Counter Support  - 1 x daily - 7 x weekly - 2 sets - 20 reps - Standing Hip Abduction with Counter Support  - 1 x daily - 7 x weekly - 3 sets - 10 reps - Standing Hip Extension with Counter Support  - 1 x daily - 7 x weekly - 3 sets - 10 reps - Seated Long Arc Quad  - 1 x daily - 7 x weekly - 2 sets - 10 reps - Sit to Stand  - 1 x daily - 7 x weekly - 3 sets - 5 reps - Standing Row with Anchored  Resistance  - 1 x daily - 7 x weekly - 1 sets - 20 reps - Standing Shoulder  Extension with Resistance  - 1 x daily - 7 x weekly - 1 sets - 20 reps  ASSESSMENT:   CLINICAL IMPRESSION: Pt has missed several weeks of PT secondary to return of infection along abdominal incision from surgery last year.  He is on medication for this making him weak and dizzy.  He has been compliant with standing tband therex.  He met LTG of 5x sit to stand today, performing in 14" compared to 17" at eval.  He did not feel steady enough today to perform repeat BERG or DGI.  Gait appeared steady today and Pt able to progress to tandem and narrow BOS with head turns and eyes closed.  ERO due next visit.  Pt would benefit from extension of PT especially given missed appointments due to medical status with infection.       OBJECTIVE IMPAIRMENTS Abnormal gait, decreased activity tolerance, decreased balance, decreased coordination, decreased mobility, decreased ROM, decreased strength, hypomobility, impaired flexibility, and postural dysfunction.    ACTIVITY LIMITATIONS bending, squatting, stairs, transfers, bed mobility, and locomotion level   PARTICIPATION LIMITATIONS: cleaning, driving, shopping, community activity, and yard work   [place] Age and 1-2 comorbidities: 3x abdominal hernia repair, Hx of a-fib with cardioversion, bil LE edema, Hx of pulmonary embolis  are also affecting patient's functional outcome.    REHAB POTENTIAL: Good   CLINICAL DECISION MAKING: Stable/uncomplicated   EVALUATION COMPLEXITY: Low     GOALS: Goals reviewed with patient? Yes   SHORT TERM GOALS: Target date: 01/11/2022   Pt will be ind with HEP  Baseline: Goal status: MET   2.  Pt will improve LE flexibility to [facility] to allow for greater ease with bending and bed mobility Baseline:  Goal status: MET       LONG TERM GOALS: Target date: 02/08/22   Pt will be ind with advanced HEP and understand importance of  compliance Baseline:  Goal status: ONGOING   2.  Pt will improve BERG balance test to at least 48/56 to demo improved safety Baseline:  Goal status: INITIAL   3.  Pt will improve DGI to at least 18/24 to demo improved safety with dynamic gait tasks. Baseline:  Goal status: INITIAL   4.  Pt will improve 5x sit to stand to </= 15 sec without use of hands to demo reduced fall risk. Baseline: 14 sec on 8/29 Goal status: met         PLAN: PT FREQUENCY: 1-2x/week   PT DURATION: 8 weeks   PLANNED INTERVENTIONS: Therapeutic exercises, Therapeutic activity, Neuromuscular re-education, Balance training, Gait training, Patient/Family education, Joint mobilization, Electrical stimulation, Spinal mobilization, and Manual therapy.   PLAN FOR NEXT SESSION: update 5x sit to stand and DGI measures, progress general mobility (spine ROM and stretches as tol) and functional strength and balance tasks, Pt has prior HEP from rehab he may bring to review, Pt needs HEP streamlined to 15 min daily for compliance (Per his report)       Baruch Merl, PT 02/01/22 1:15 PM

## 2022-02-08 ENCOUNTER — Ambulatory Visit: Payer: Medicare Other | Admitting: Physical Therapy

## 2022-02-15 ENCOUNTER — Ambulatory Visit: Payer: Medicare Other | Admitting: Infectious Disease

## 2022-02-17 ENCOUNTER — Ambulatory Visit: Payer: Medicare Other | Admitting: Physical Therapy

## 2022-02-21 ENCOUNTER — Ambulatory Visit (INDEPENDENT_AMBULATORY_CARE_PROVIDER_SITE_OTHER): Payer: Medicare Other | Admitting: Infectious Disease

## 2022-02-21 ENCOUNTER — Encounter: Payer: Self-pay | Admitting: Infectious Disease

## 2022-02-21 ENCOUNTER — Other Ambulatory Visit: Payer: Self-pay

## 2022-02-21 VITALS — BP 148/85 | HR 67 | Resp 16 | Ht 73.0 in | Wt 246.8 lb

## 2022-02-21 DIAGNOSIS — K651 Peritoneal abscess: Secondary | ICD-10-CM

## 2022-02-21 DIAGNOSIS — E1169 Type 2 diabetes mellitus with other specified complication: Secondary | ICD-10-CM | POA: Diagnosis not present

## 2022-02-21 DIAGNOSIS — T8579XA Infection and inflammatory reaction due to other internal prosthetic devices, implants and grafts, initial encounter: Secondary | ICD-10-CM

## 2022-02-21 DIAGNOSIS — T8143XA Infection following a procedure, organ and space surgical site, initial encounter: Secondary | ICD-10-CM | POA: Insufficient documentation

## 2022-02-21 DIAGNOSIS — I480 Paroxysmal atrial fibrillation: Secondary | ICD-10-CM

## 2022-02-21 DIAGNOSIS — Z7984 Long term (current) use of oral hypoglycemic drugs: Secondary | ICD-10-CM

## 2022-02-21 DIAGNOSIS — I10 Essential (primary) hypertension: Secondary | ICD-10-CM

## 2022-02-21 DIAGNOSIS — Z7901 Long term (current) use of anticoagulants: Secondary | ICD-10-CM

## 2022-02-21 DIAGNOSIS — Z86711 Personal history of pulmonary embolism: Secondary | ICD-10-CM

## 2022-02-21 DIAGNOSIS — Z6832 Body mass index (BMI) 32.0-32.9, adult: Secondary | ICD-10-CM

## 2022-02-21 DIAGNOSIS — E6609 Other obesity due to excess calories: Secondary | ICD-10-CM | POA: Insufficient documentation

## 2022-02-21 HISTORY — DX: Peritoneal abscess: K65.1

## 2022-02-21 HISTORY — DX: Morbid (severe) obesity due to excess calories: E66.01

## 2022-02-21 HISTORY — DX: Infection and inflammatory reaction due to other internal prosthetic devices, implants and grafts, initial encounter: T85.79XA

## 2022-02-21 MED ORDER — HYDRALAZINE HCL 50 MG PO TABS: 50.0000 mg | ORAL_TABLET | Freq: Two times a day (BID) | ORAL | 3 refills | Status: DC

## 2022-02-21 NOTE — Progress Notes (Signed)
Subjective:  Reason for infectious disease consult: Concern for infected mesh plus minus intra-abdominal abscess  Requesting physician: Kathalene Frames, MD   Patient ID: Omar Chen, male    DOB: 01/20/1946, 76 y.o.   MRN: [id number]  HPI  Omar Chen is a 76 year old Caucasian man with multiple medical problems who had been originally evaluated in [facility] for ventral hernia repair.  He was ultimately sent to [facility] where he underwent laparoscopic ventral hernia.  This ultimately failed and he had recurrent herniation and was again treated with laparoscopic repair.  Still had a large defect and ultimately he was taken to the operating room by Dr. Nigel Sloop who is at the time practicing at [facility] health, where he removed 90% of the mesh with primary reapproximation of fascia skeletonization of anterior abdominal wall fascia with onlay mesh reinforcement over large posterior mass.  Patient's postop course was complicated by new onset atrial fibrillation and pulmonary embolism.  Apparently he developed postoperative fevers and was seen again in the hospital for this.  He had a drain in place that had been there for a month--clear to me though whether this was placed in the context of infection or was a postoperative drain.  It seems more likely to be the latter.  In any case his surgeon had wanted the old drain removed and a new drain placed into the postoperative fluid collection so that the material could be sent for culture.  Instead a culture was taken from the drain which yielded a Staphylococcus aureus in the pseudomonal species.  I do not have access to the final cultures on those organisms and their sensitivities.  I do know that the patient was apparently sent home on ciprofloxacin and metronidazole.  When he went to the local pharmacy however the pharmacies noticed that he was on amiodarone and  said that he could not be on the fluoroquinolone.  This was apparently replaced by IV gentamicin which is fairly shocking to hear if in fact it was given alone for an infection and given 76 years old and with his co morbidities and with his co morbidities.  His course was complicated by the politics of change in his local hospital as his surgeon Dr. Fabio Neighbors and partners did not choose to stay part of the hospital system that was brought up by the lungs.  Subsequently his surgeon will no longer have privileges in the middle of taking care of [place].  He had multiple hospitalizations and ER trips according to his wife up to 14 including ER trips in the interim including prolonged hospitalization complicated by acute renal failure that did a current care in the context of the contrast apparently.  He was as mentioned on IV antibiotics for 8 weeks I would--- but is not clear what they were.  It sounds like an infectious disease physician was ultimately involved and managed the IV antibiotics though again I am lacking records.  In any case they moved to [facility] actually largely due to how horrified they were with the care he was receiving according to his wife.  He has reestablished primary care here with Dr. Koleen Nimrod with [facility].  While he has been here in [place] in August she noticed an area on his abdomen near his surgical site which began to become raised and tender and drained bloody material.  He was seen by Dr. Koleen Nimrod he found the area to appears cellulitic and he prescribed doxycycline and cephalexin.  An ultrasound  of the abdomen was ordered which showed on August 23rd, 2023 a 0.8 x 3.3 x 6.9 centimeter mass that appeared consistent with a phlegmon.  The patient himself seems to think that there is nothing found and certainly the report says there is specifically no abscess but a phlegmon is certainly an early abscess.  He was referred to [place] as a stat referral but missed his first appointment and now  came to this 1 rescheduled this week.  He says he feels fine and has no pain and certainly is in no distress whatsoever.  He has not yet been seen by anyone in [place] surgery though he does need to establish with a Biomedical engineer.  He himself is a bit reluctant to do so unless there is a clear surgical indication.  I have explained that I have concerns that he has a postoperative abscess and that he could worst-case scenario have infected mesh that will require surgical attention.  He is currently in a study conducted by [facility] which is comparing an anticoagulant from [facility] versus Eliquis if I understand correctly this is being managed by Dr. Jacinto Halim with cardiology.    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  Family History  Problem Relation Age of Onset   Rheum arthritis Mother    Cancer - Lung Father    Pancreatic cancer Sister       Social History   Socioeconomic History   Marital status: Married    Spouse name: Not on file   Number of children: 2   Years of education: Not on file   Highest education level: Not on file  Occupational History   Not on file  Tobacco Use   Smoking status: Never   Smokeless tobacco: Never  Vaping Use   Vaping Use: Never used  Substance and Sexual Activity   Alcohol use: Yes    Alcohol/week: 1.0 standard drink of alcohol    Types: 1 Standard drinks or equivalent per week   Drug use: Never   Sexual activity: Not on file  Other Topics Concern   Not on file  Social History Narrative   2 biological  - 1 step child   Social Determinants of Health   Financial Resource Strain: Not on file  Food Insecurity: Not on file  Transportation Needs: Not  on file  Physical Activity: Not on file  Stress: Not on file  Social Connections: Not on file    No Known Allergies   Current Outpatient Medications:    apixaban (ELIQUIS) 5 MG TABS tablet, 1 tablet 2 TIMES DAILY (route: oral), Disp: , Rfl:    hydrALAZINE (APRESOLINE) 25 MG tablet, 25 mg 3 TIMES DAILY (route: oral), Disp: , Rfl:    isosorbide dinitrate (ISORDIL) 30 MG tablet, 30 mg 3 TIMES DAILY (route: oral), Disp: , Rfl:    linagliptin (TRADJENTA) 5 MG TABS tablet, 1 tablet DAILY (route: oral), Disp: , Rfl:    metoprolol succinate (TOPROL-XL) 50 MG 24 hr tablet, 1 tablet DAILY (route: oral), Disp: , Rfl:    pravastatin (PRAVACHOL) 40 MG tablet, 1 tablet DAILY (route: oral), Disp: , Rfl:    FARXIGA 10 MG TABS  tablet, Take 5 mg by mouth daily., Disp: , Rfl:    fluticasone (FLONASE) 50 MCG/ACT nasal spray, Place 1-2 sprays into the nose as needed for allergies., Disp: , Rfl:    mupirocin ointment (BACTROBAN) 2 %, Apply 1 application  topically 2 (two) times daily., Disp: , Rfl:    olopatadine (PATANOL) 0.1 % ophthalmic solution, Apply 1-2 drops to eye., Disp: , Rfl:    Review of Systems  Constitutional:  Negative for activity change, appetite change, chills, diaphoresis, fatigue, fever and unexpected weight change.  HENT:  Negative for congestion, rhinorrhea, sinus pressure, sneezing, sore throat and trouble swallowing.   Eyes:  Negative for photophobia and visual disturbance.  Respiratory:  Negative for cough, chest tightness, shortness of breath, wheezing and stridor.   Cardiovascular:  Negative for chest pain, palpitations and leg swelling.  Gastrointestinal:  Negative for abdominal distention, abdominal pain, anal bleeding, blood in stool, constipation, diarrhea, nausea and vomiting.  Genitourinary:  Negative for difficulty urinating, dysuria, flank pain and hematuria.  Musculoskeletal:  Negative for arthralgias, back pain, gait problem, joint swelling and myalgias.  Skin:  Negative  for color change, pallor, rash and wound.  Neurological:  Negative for dizziness, tremors, weakness and light-headedness.  Hematological:  Negative for adenopathy. Does not bruise/bleed easily.  Psychiatric/Behavioral:  Negative for agitation, behavioral problems, confusion, decreased concentration, dysphoric mood and sleep disturbance.        Objective:   Physical Exam Constitutional:      Appearance: He is well-developed.  HENT:     Head: Normocephalic and atraumatic.  Eyes:     Conjunctiva/sclera: Conjunctivae normal.  Cardiovascular:     Rate and Rhythm: Normal rate and regular rhythm.  Pulmonary:     Effort: Pulmonary effort is normal. No respiratory distress.     Breath sounds: No wheezing.  Abdominal:     General: There is no distension.     Palpations: Abdomen is soft.  Musculoskeletal:        General: No tenderness. Normal range of motion.     Cervical back: Normal range of motion and neck supple.  Skin:    General: Skin is warm and dry.     Coloration: Skin is not pale.     Findings: No erythema or rash.  Neurological:     General: No focal deficit present.     Mental Status: He is alert and oriented to person, place, and time.  Psychiatric:        Mood and Affect: Mood normal.        Behavior: Behavior normal.        Thought Content: Thought content normal.        Judgment: Judgment normal.     Abdominal area where phlegmon was seen on scan and where there is palpable induration February 21, 2022:         Assessment & Plan:  Postoperative abscess plus or minus infected mesh:  Obtaining a CBC with differential and a CMP with GFR.  I am ordering a CT without contrast though if his renal function was normal now I would ideally like to give IV contrast as well (the patient himself is very wary of contrast having suffered acute renal failure while in [place])  I DO think he will at minimum need placement of a drain by interventional radiology into  I would think would still be an abscess in the abdomen and material sent for culture to guide antiibiotics  My greater concern and fear  is that his mesh is infected and will need to be removed by a surgeon  History of pulmonary embolism and atrial fibrillation currently on anticoagulant through Dr. Jacinto Halim study.  Diabetes mellitus in the context of obesity he is lost 60 pounds through current management.  I spent 90 minutes with the patient including than 50% of the time in face to face counseling of the patient wife regarding the nature of postoperative infections the concern I have for potential mesh infection, personally reviewing ultrasound the abdomen performed in August 23 along with review of medical records in preparation for the visit and during the visit and in coordination of nis care.

## 2022-02-22 ENCOUNTER — Telehealth: Payer: Self-pay

## 2022-02-22 LAB — CBC WITH DIFFERENTIAL/PLATELET
Absolute Monocytes: 464 cells/uL (ref 200–950)
Basophils Absolute: 52 cells/uL (ref 0–200)
Basophils Relative: 0.9 %
Eosinophils Absolute: 128 cells/uL (ref 15–500)
Eosinophils Relative: 2.2 %
HCT: 39.3 % (ref 38.5–50.0)
Hemoglobin: 13.4 g/dL (ref 13.2–17.1)
Lymphs Abs: 1908 cells/uL (ref 850–3900)
MCH: 31.5 pg (ref 27.0–33.0)
MCHC: 34.1 g/dL (ref 32.0–36.0)
MCV: 92.3 fL (ref 80.0–100.0)
MPV: 10 fL (ref 7.5–12.5)
Monocytes Relative: 8 %
Neutro Abs: 3248 cells/uL (ref 1500–7800)
Neutrophils Relative %: 56 %
Platelets: 126 10*3/uL — ABNORMAL LOW (ref 140–400)
RBC: 4.26 10*6/uL (ref 4.20–5.80)
RDW: 13.1 % (ref 11.0–15.0)
Total Lymphocyte: 32.9 %
WBC: 5.8 10*3/uL (ref 3.8–10.8)

## 2022-02-22 LAB — COMPLETE METABOLIC PANEL WITH GFR
AG Ratio: 1.5 (calc) (ref 1.0–2.5)
ALT: 10 U/L (ref 9–46)
AST: 8 U/L — ABNORMAL LOW (ref 10–35)
Albumin: 3.9 g/dL (ref 3.6–5.1)
Alkaline phosphatase (APISO): 49 U/L (ref 35–144)
BUN/Creatinine Ratio: 14 (calc) (ref 6–22)
BUN: 24 mg/dL (ref 7–25)
CO2: 28 mmol/L (ref 20–32)
Calcium: 9 mg/dL (ref 8.6–10.3)
Chloride: 102 mmol/L (ref 98–110)
Creat: 1.66 mg/dL — ABNORMAL HIGH (ref 0.70–1.28)
Globulin: 2.6 g/dL (calc) (ref 1.9–3.7)
Glucose, Bld: 107 mg/dL — ABNORMAL HIGH (ref 65–99)
Potassium: 3.7 mmol/L (ref 3.5–5.3)
Sodium: 139 mmol/L (ref 135–146)
Total Bilirubin: 1 mg/dL (ref 0.2–1.2)
Total Protein: 6.5 g/dL (ref 6.1–8.1)
eGFR: 43 mL/min/{1.73_m2} — ABNORMAL LOW (ref >=60)

## 2022-02-22 LAB — C-REACTIVE PROTEIN: CRP: 1.8 mg/L (ref <8.0)

## 2022-02-22 LAB — SEDIMENTATION RATE: Sed Rate: 6 mm/h (ref 0–20)

## 2022-02-22 NOTE — Telephone Encounter (Signed)
Patient called wanting to discuss lab results from yesterday. Relayed that inflammatory markers were normal, but that creatinine was elevated at 1.66. recommended he stay hydrated. Will route to provider.   Beryle Flock, RN

## 2022-02-23 NOTE — Telephone Encounter (Signed)
Patient called back regarding the CT scan scheduled. Patient asking if he still needs the the CT scan. Patient advised per Dr. Tommy Medal he still needs to have this done. Patient verbalized understanding. Rhiannon Sassaman T Brooks Sailors

## 2022-03-02 ENCOUNTER — Ambulatory Visit (HOSPITAL_COMMUNITY)
Admission: RE | Admit: 2022-03-02 | Discharge: 2022-03-02 | Disposition: A | Discharge status: Home / Self Care | Payer: Medicare Other | Source: Ambulatory Visit | Attending: Infectious Disease | Admitting: Infectious Disease

## 2022-03-02 DIAGNOSIS — T8579XA Infection and inflammatory reaction due to other internal prosthetic devices, implants and grafts, initial encounter: Secondary | ICD-10-CM | POA: Insufficient documentation

## 2022-03-02 DIAGNOSIS — T8143XA Infection following a procedure, organ and space surgical site, initial encounter: Secondary | ICD-10-CM | POA: Diagnosis present

## 2022-03-04 ENCOUNTER — Other Ambulatory Visit: Payer: Self-pay | Admitting: Infectious Disease

## 2022-03-04 DIAGNOSIS — L0291 Cutaneous abscess, unspecified: Secondary | ICD-10-CM

## 2022-03-04 DIAGNOSIS — Z8719 Personal history of other diseases of the digestive system: Secondary | ICD-10-CM

## 2022-03-07 ENCOUNTER — Telehealth: Payer: Self-pay

## 2022-03-07 NOTE — Telephone Encounter (Signed)
Connected with patient regarding Ct results. Patient would like more information what CT shows before following up with Surgery team.  Leatrice Jewels, RMA

## 2022-03-07 NOTE — Telephone Encounter (Signed)
-----   Message from Truman Hayward, MD sent at 03/04/2022 12:36 PM EDT ----- He needs to be seen by surgery for evaluation of this abscess that seems associated with his hernia repair ----- Message ----- From: Interface, Rad Results In Sent: 03/04/2022   8:22 AM EDT To: Truman Hayward, MD

## 2022-03-09 ENCOUNTER — Ambulatory Visit: Payer: Medicare Other | Admitting: Physical Therapy

## 2022-03-16 ENCOUNTER — Ambulatory Visit: Payer: Medicare Other | Attending: Internal Medicine | Admitting: Physical Therapy

## 2022-03-16 ENCOUNTER — Encounter: Payer: Self-pay | Admitting: Physical Therapy

## 2022-03-16 DIAGNOSIS — M6281 Muscle weakness (generalized): Secondary | ICD-10-CM | POA: Insufficient documentation

## 2022-03-16 DIAGNOSIS — R2689 Other abnormalities of gait and mobility: Secondary | ICD-10-CM | POA: Diagnosis not present

## 2022-03-16 NOTE — Therapy (Signed)
OUTPATIENT PHYSICAL THERAPY TREATMENT NOTE   Patient Name: Vimal Derego MRN: [id number] DOB:1945-10-12, 76 y.o., male Today's Date: 03/16/2022  REFERRING PROVIDER: Kathalene Frames, MD   END OF SESSION:   PT End of Session - 03/16/22 1224     Visit Number 6    Date for PT Re-Evaluation 04/27/22    Authorization Type Medicare Part A/B, KX at visit 15    Progress Note Due on Visit 10    PT Start Time 1223    PT Stop Time 1309    PT Time Calculation (min) 46 min    Activity Tolerance Patient tolerated treatment well    Behavior During Therapy [facility] for tasks assessed/performed                 Past Medical History:  Diagnosis Date   Arrhythmia    Atrial fib post surgery Dec 2022   Chronic kidney disease    Diabetes mellitus without complication ([place])    Hyperlipidemia    Hypertension    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 No pertinent surgical history. 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   Mixed hyperlipidemia 12/06/2013   Shortness of breath 12/06/2013   Thrombocytopenia ([place]) 12/06/2013   PVD (posterior vitreous detachment) 01/09/2013   Hx pulmonary embolism 06/07/1999    REFERRING DIAG: R26.89 (ICD-10-CM) - Other abnormalities of gait and mobility   THERAPY DIAG:  Other abnormalities of gait and mobility - Plan: PT plan of care  cert/re-cert  Muscle weakness (generalized) - Plan: PT plan of care cert/re-cert  Rationale for Evaluation and Treatment Rehabilitation  PERTINENT HISTORY: on anticoagulation, ventral hernia repair developed postoperative complications needing long stay in the hospital and also developed paroxysmal atrial fibrillation underwent cardioversion  ONSET DATE: following post-op complications with ventral hernia repair, needed long stay in hospital and developed a-fib, underwent cardioversion, lost mobility, strength, endurance    PRECAUTIONS: heart  SUBJECTIVE: I was gone from PT for awhile due to abdominal infection, it's all healed now.  I have only been doing my band exercises from my HEP.  I have been active doing other things around the house and outside like getting the mail.    PAIN:    Are you having pain?  No  OBJECTIVE:    DIAGNOSTIC FINDINGS:  N/A     COGNITION:           Overall cognitive status: Within functional limits for tasks assessed                          SENSATION: WFL   MUSCLE LENGTH: 10/11:   Eval: Hamstrings: Right 45 deg; Left 45 deg Limited gluteals and piriformis by 60% bil   POSTURE: decreased lumbar lordosis and flexed trunk    PALPATION: Bil LE edema observed   LUMBAR ROM:    Active  A/PROM  eval A/ROM 10/11  Flexion 30, bends knees 40 deg,  bends knees  Extension 5 10  Right lateral flexion 10 10  Left lateral flexion 10 10  Right rotation 30% 50%  Left rotation 30% 50%   (Blank rows = not tested)   LOWER EXTREMITY ROM:      03/16/22:hip ROM grossly limited 50% bil  Eval: Hip ROM grossly limited 50-75% bil   LOWER EXTREMITY MMT:     03/16/22: grossly 4+/5 bil LE             Eval: Grossly 4+/5 bil LE       FUNCTIONAL TESTS:  03/16/22: 5x sit to stand: 15 sec with hands TUG: 11 sec, short stride length, uses UE to rise and sit from/to chair Berg: 44/56 DGI: 17/24  02/01/22: 5x sit to stand: 14 sec with  hands   Evaluation:  5 times sit to stand: 17 sec with hands Timed up and go (TUG): 11 sec Berg Balance Scale: 40/56 Dynamic Gait Index: 12/24   GAIT: Distance walked: within clinic Assistive device utilized: None Level of assistance: Complete Independence Comments: lacks trunk rotation, wide BOS, flexed trunk       TODAY'S TREATMENT  03/16/22: DGI, BERG, TUG and 5x sit to stand At counter: High knee march x 20 Single leg stance with single UE support 3 rounds 15 sec each LE Tandem stance single UE support 3x15 sec each LE  02/01/22: Standing at barre warm up: high knee march alt LE x 30, sidestepping along barre x 4 laps no UE support, bil heel raise x 10, SLS 3x10 each LE light single UE support 5x sit to stand (14 sec today) Tandem walk along barre x 4 laps, intermittent UE use Standing red band bil shoulder ext x 20 Standing blue band bil shoulder row x 20 Standing on airex pad march taps to 1st step x 10, to 2nd step x 10 Up/down 4 6" steps step-to pattern Tandem stance and narrow BOS single arm support: head turns Lt/Rt x10 each position Tandem stance and narrow BOS single arm support: eyes closed 10" each position   01/04/22: NuStep L4 x 5' seat 12/arms 12 PT present to monitor Seated 2x10 each, bil:  heel toe raises, LAQ 2lb, march 5lb kbell on thigh SLS 3x10 Rt/Lt single UE support Tandem stance 1x30" single light UE support to intermittent no UE support Standing red band bil shoulder ext x 20 Standing blue band bil shoulder row x 20 High knee walk single UE support along barre x 4 laps down and back  12/30/21: NuStep L4 x 5' seat 12/arms 12 PT present to monitor Standing bwd resistance walking 10lb x 5, supervision Seated 2x10 each, bil:  heel toe raises, LAQ, march alt LE SLS 3x10 Rt/Lt single UE support Tandem stance 2x30" single light UE support to intermittent no UE support Standing bil row blue x 20, bil shoulder extension red x 20 (added to HEP) Sit to  stand from chair x 5 with UE support High knee walk single UE support along barre x 2 laps down and back     PATIENT EDUCATION:  Education details: Access Code: [email or web address] Person educated: Patient Education method: Explanation, Demonstration, and Handouts Education comprehension: verbalized understanding and returned demonstration     HOME EXERCISE PROGRAM: Access Code: [email or web address] URL: [email or web address] Date: 03/16/2022 Prepared by: Venetia Night Raul Winterhalter  Exercises - Seated Hamstring Stretch  - 3 x daily - 7 x weekly - 1 sets - 3 reps - 30 hold - Hooklying Single Knee to Chest Stretch  -  1 x daily - 7 x weekly - 2 sets - 10 reps - Supine Lower Trunk Rotation  - 1 x daily - 7 x weekly - 2 sets - 10 reps - Heel Raises with Counter Support  - 1 x daily - 7 x weekly - 2 sets - 10 reps - Seated Heel Toe Raises  - 3 x daily - 7 x weekly - 1 sets - 20 reps - Standing Hip Abduction with Counter Support  - 1 x daily - 7 x weekly - 3 sets - 10 reps - Standing Hip Extension with Counter Support  - 1 x daily - 7 x weekly - 3 sets - 10 reps - Seated Long Arc Quad  - 1 x daily - 7 x weekly - 2 sets - 10 reps - Sit to Stand  - 1 x daily - 7 x weekly - 3 sets - 5 reps - Standing Row with Anchored Resistance  - 1 x daily - 7 x weekly - 1 sets - 20 reps - Standing Shoulder Extension with Resistance  - 1 x daily - 7 x weekly - 1 sets - 20 reps - Standing March with Counter Support  - 1 x daily - 7 x weekly - 1 sets - 20 reps - Standing Tandem Balance with Unilateral Counter Support  - 1 x daily - 7 x weekly - 1 sets - 3 reps - 15 sec hold - Standing Single Leg Stance with Unilateral Counter Support  - 1 x daily - 7 x weekly - 1 sets - 10 reps - 15 sec hold  ASSESSMENT:   CLINICAL IMPRESSION: Pt with lapse of care for 1.5 mos secondary to return of infection along abdominal incision from surgery last year.  Recent CT suggests possible infection related to surgical mesh and Pt has been  referred to surgery per chart review.  He reports he is healed and feels 100% ready to return to PT.  He does have an appt with infectious disease in a week. Pt continues to display challenges with static and dynamic gait stabilty via DGI and balance with BERG Balance Test, reassessed today (see above).  Scores have improved since initial evaluation.  Pt has short stride with gait and difficulty in narrow base of support, tandem stance, stepping over objects, and SLS.  PT recommends renewal of plan of care given missed visits secondary to medical reasons to improve safety, balance, strength and functional mobility.     OBJECTIVE IMPAIRMENTS Abnormal gait, decreased activity tolerance, decreased balance, decreased coordination, decreased mobility, decreased ROM, decreased strength, hypomobility, impaired flexibility, and postural dysfunction.    ACTIVITY LIMITATIONS bending, squatting, stairs, transfers, bed mobility, and locomotion level   PARTICIPATION LIMITATIONS: cleaning, driving, shopping, community activity, and yard work   [place] Age and 1-2 comorbidities: 3x abdominal hernia repair, Hx of a-fib with cardioversion, bil LE edema, Hx of pulmonary embolis  are also affecting patient's functional outcome.    REHAB POTENTIAL: Good   CLINICAL DECISION MAKING: Stable/uncomplicated   EVALUATION COMPLEXITY: Low     GOALS: Goals reviewed with patient? Yes   SHORT TERM GOALS: Target date: 01/11/2022   Pt will be ind with HEP  Baseline: Goal status: MET   2.  Pt will improve LE flexibility to [facility] to allow for greater ease with bending and bed mobility Baseline:  Goal status: MET       LONG TERM GOALS: Target date: 02/08/22   Pt will be ind with advanced  HEP and understand importance of compliance Baseline:  Goal status: ONGOING   2.  Pt will improve BERG balance test to at least 48/56 to demo improved safety Baseline: 44/56 on 10/11 Goal status: ONGOING   3.  Pt will  improve DGI to at least 18/24 to demo improved safety with dynamic gait tasks. Baseline: 17/24 on 10/11 Goal status: ONGOING   4.  Pt will improve 5x sit to stand to </= 15 sec without use of hands to demo reduced fall risk. Baseline: 14 sec on 8/29 Goal status: met         PLAN: PT FREQUENCY: 1-2x/week   PT DURATION: 8 weeks   PLANNED INTERVENTIONS: Therapeutic exercises, Therapeutic activity, Neuromuscular re-education, Balance training, Gait training, Patient/Family education, Joint mobilization, Electrical stimulation, Spinal mobilization, and Manual therapy.   PLAN FOR NEXT SESSION:  functional mobility, strength, balance (static and dynamic), HEP that can be done within 15 min per Pt request for compliance     Austen Wygant, PT 03/16/22 1:13 PM

## 2022-03-21 ENCOUNTER — Ambulatory Visit: Payer: Medicare Other | Admitting: Infectious Disease

## 2022-03-23 ENCOUNTER — Encounter: Payer: Self-pay | Admitting: Physical Therapy

## 2022-03-23 ENCOUNTER — Ambulatory Visit: Payer: Medicare Other | Admitting: Physical Therapy

## 2022-03-23 DIAGNOSIS — M6281 Muscle weakness (generalized): Secondary | ICD-10-CM

## 2022-03-23 DIAGNOSIS — R2689 Other abnormalities of gait and mobility: Secondary | ICD-10-CM | POA: Diagnosis not present

## 2022-03-23 NOTE — Therapy (Signed)
OUTPATIENT PHYSICAL THERAPY TREATMENT NOTE   Patient Name: Omar Chen MRN: [id number] DOB:May 04, 1946, 76 y.o., male Today's Date: 03/23/2022  REFERRING PROVIDER: Kathalene Frames, MD   END OF SESSION:   PT End of Session - 03/23/22 1231     Visit Number 7    Date for PT Re-Evaluation 04/27/22    Authorization Type Medicare Part A/B, KX at visit 15    Progress Note Due on Visit 10    PT Start Time 1231    PT Stop Time 1310    PT Time Calculation (min) 39 min    Activity Tolerance Patient tolerated treatment well    Behavior During Therapy [facility] for tasks assessed/performed                  Past Medical History:  Diagnosis Date   Arrhythmia    Atrial fib post surgery Dec 2022   Chronic kidney disease    Diabetes mellitus without complication ([place])    Hyperlipidemia    Hypertension    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 No pertinent surgical history. 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   Mixed hyperlipidemia 12/06/2013   Shortness of breath 12/06/2013   Thrombocytopenia ([place]) 12/06/2013   PVD (posterior vitreous detachment) 01/09/2013   Hx pulmonary embolism 06/07/1999    REFERRING DIAG: R26.89 (ICD-10-CM) - Other abnormalities of gait and mobility   THERAPY DIAG:  Other abnormalities of gait and mobility  Muscle weakness  (generalized)  Rationale for Evaluation and Treatment Rehabilitation  PERTINENT HISTORY: on anticoagulation, ventral hernia repair developed postoperative complications needing long stay in the hospital and also developed paroxysmal atrial fibrillation underwent cardioversion  ONSET DATE: following post-op complications with ventral hernia repair, needed long stay in hospital and developed a-fib, underwent cardioversion, lost mobility, strength, endurance    PRECAUTIONS: heart  SUBJECTIVE: I have been doing the balance exercises.  I am not seeing any improvement in feeling steady when I go out to get the mail.      PAIN:    Are you having pain?  No  OBJECTIVE:    DIAGNOSTIC FINDINGS:  N/A     COGNITION:           Overall cognitive status: Within functional limits for tasks assessed                          SENSATION: WFL   MUSCLE LENGTH: Eval: Hamstrings: Right 45 deg; Left 45 deg Limited gluteals and piriformis by 60% bil   POSTURE: decreased lumbar lordosis and flexed trunk    PALPATION: Bil LE edema observed   LUMBAR ROM:    Active  A/PROM  eval A/ROM 10/11  Flexion 30, bends knees 40 deg, bends knees  Extension 5 10  Right lateral flexion 10 10  Left lateral flexion 10 10  Right rotation 30% 50%  Left rotation 30% 50%   (Blank rows =  not tested)   LOWER EXTREMITY ROM:      03/16/22:hip ROM grossly limited 50% bil  Eval: Hip ROM grossly limited 50-75% bil   LOWER EXTREMITY MMT:     03/16/22: grossly 4+/5 bil LE             Eval: Grossly 4+/5 bil LE       FUNCTIONAL TESTS:  03/16/22: 5x sit to stand: 15 sec with hands TUG: 11 sec, short stride length, uses UE to rise and sit from/to chair Berg: 44/56 DGI: 17/24  02/01/22: 5x sit to stand: 14 sec with hands   Evaluation:  5 times sit to stand: 17 sec with hands Timed up and go (TUG): 11 sec Berg Balance Scale: 40/56 Dynamic Gait Index: 12/24   GAIT: Distance walked: within  clinic Assistive device utilized: None Level of assistance: Complete Independence Comments: lacks trunk rotation, wide BOS, flexed trunk       TODAY'S TREATMENT  03/23/22: NuStep L3 x 4' PT present to monitor Gait training with gait belt: Step to color discs on floor with VC of 1 and 3 color sequences lateral and diag/fwd March taps to tall cone until success with 3 in a row each LE x 1 round, needed VC for improved weight shift into stance leg Stairs with single rail 3x4 6" steps, step through going up, step to coming down Along barre high knee march fwd/bwd x 5 passes, no UE support going fwd, single UE support going bwd, close supervision Seated LAQ 2x10 3lb  Seated march 3lb ankle weights x 20 alt LE Seated hamstring curl red loop band at ankles alt LE x 20  03/16/22: DGI, BERG, TUG and 5x sit to stand At counter: High knee march x 20 Single leg stance with single UE support 3 rounds 15 sec each LE Tandem stance single UE support 3x15 sec each LE  02/01/22: Standing at barre warm up: high knee march alt LE x 30, sidestepping along barre x 4 laps no UE support, bil heel raise x 10, SLS 3x10 each LE light single UE support 5x sit to stand (14 sec today) Tandem walk along barre x 4 laps, intermittent UE use Standing red band bil shoulder ext x 20 Standing blue band bil shoulder row x 20 Standing on airex pad march taps to 1st step x 10, to 2nd step x 10 Up/down 4 6" steps step-to pattern Tandem stance and narrow BOS single arm support: head turns Lt/Rt x10 each position Tandem stance and narrow BOS single arm support: eyes closed 10" each position   01/04/22: NuStep L4 x 5' seat 12/arms 12 PT present to monitor Seated 2x10 each, bil:  heel toe raises, LAQ 2lb, march 5lb kbell on thigh SLS 3x10 Rt/Lt single UE support Tandem stance 1x30" single light UE support to intermittent no UE support Standing red band bil shoulder ext x 20 Standing blue band bil shoulder row x 20 High  knee walk single UE support along barre x 4 laps down and back      PATIENT EDUCATION:  Education details: Access Code: [email or web address] Person educated: Patient Education method: Explanation, Demonstration, and Handouts Education comprehension: verbalized understanding and returned demonstration     HOME EXERCISE PROGRAM: Access Code: [email or web address] URL: [email or web address] Date: 03/16/2022 Prepared by: Venetia Night Vora Clover  Exercises - Seated Hamstring Stretch  - 3 x daily - 7 x weekly - 1 sets - 3 reps - 30 hold - Hooklying Single Knee to Chest Stretch  - 1  x daily - 7 x weekly - 2 sets - 10 reps - Supine Lower Trunk Rotation  - 1 x daily - 7 x weekly - 2 sets - 10 reps - Heel Raises with Counter Support  - 1 x daily - 7 x weekly - 2 sets - 10 reps - Seated Heel Toe Raises  - 3 x daily - 7 x weekly - 1 sets - 20 reps - Standing Hip Abduction with Counter Support  - 1 x daily - 7 x weekly - 3 sets - 10 reps - Standing Hip Extension with Counter Support  - 1 x daily - 7 x weekly - 3 sets - 10 reps - Seated Long Arc Quad  - 1 x daily - 7 x weekly - 2 sets - 10 reps - Sit to Stand  - 1 x daily - 7 x weekly - 3 sets - 5 reps - Standing Row with Anchored Resistance  - 1 x daily - 7 x weekly - 1 sets - 20 reps - Standing Shoulder Extension with Resistance  - 1 x daily - 7 x weekly - 1 sets - 20 reps - Standing March with Counter Support  - 1 x daily - 7 x weekly - 1 sets - 20 reps - Standing Tandem Balance with Unilateral Counter Support  - 1 x daily - 7 x weekly - 1 sets - 3 reps - 15 sec hold - Standing Single Leg Stance with Unilateral Counter Support  - 1 x daily - 7 x weekly - 1 sets - 10 reps - 15 sec hold  ASSESSMENT:   CLINICAL IMPRESSION: Pt arrived stating it was a bad day today but did not elaborate.  He was able to perform multi-directional steps to color discs on floor with VC today but had great challenge with march taps to cone.  This improved with cue to weight shift  first into contralateral leg but he cannot balance in SLS long enough to perform this task consistently.  He feels unsafe going out to get mail each day which involves stairs with single rail and elevation up/down hill of driveway.  He declined walking outside today for hill work with gait belt but said maybe next time.       OBJECTIVE IMPAIRMENTS Abnormal gait, decreased activity tolerance, decreased balance, decreased coordination, decreased mobility, decreased ROM, decreased strength, hypomobility, impaired flexibility, and postural dysfunction.    ACTIVITY LIMITATIONS bending, squatting, stairs, transfers, bed mobility, and locomotion level   PARTICIPATION LIMITATIONS: cleaning, driving, shopping, community activity, and yard work   [place] Age and 1-2 comorbidities: 3x abdominal hernia repair, Hx of a-fib with cardioversion, bil LE edema, Hx of pulmonary embolis  are also affecting patient's functional outcome.    REHAB POTENTIAL: Good   CLINICAL DECISION MAKING: Stable/uncomplicated   EVALUATION COMPLEXITY: Low     GOALS: Goals reviewed with patient? Yes   SHORT TERM GOALS: Target date: 01/11/2022   Pt will be ind with HEP  Baseline: Goal status: MET   2.  Pt will improve LE flexibility to [facility] to allow for greater ease with bending and bed mobility Baseline:  Goal status: MET       LONG TERM GOALS: Target date: 02/08/22   Pt will be ind with advanced HEP and understand importance of compliance Baseline:  Goal status: ONGOING   2.  Pt will improve BERG balance test to at least 48/56 to demo improved safety Baseline: 44/56 on 10/11 Goal status: ONGOING  3.  Pt will improve DGI to at least 18/24 to demo improved safety with dynamic gait tasks. Baseline: 17/24 on 10/11 Goal status: ONGOING   4.  Pt will improve 5x sit to stand to </= 15 sec without use of hands to demo reduced fall risk. Baseline: 14 sec on 8/29 Goal status: met         PLAN: PT  FREQUENCY: 1-2x/week   PT DURATION: 8 weeks   PLANNED INTERVENTIONS: Therapeutic exercises, Therapeutic activity, Neuromuscular re-education, Balance training, Gait training, Patient/Family education, Joint mobilization, Electrical stimulation, Spinal mobilization, and Manual therapy.   PLAN FOR NEXT SESSION:  NuStep, outdoor ambulation for hill work, hurdles in parallel bars, functional mobility, strength, balance (static and dynamic), HEP that can be done within 15 min per Pt request for compliance     Lorry Furber, PT 03/23/22 1:15 PM

## 2022-03-24 ENCOUNTER — Telehealth: Payer: Self-pay | Admitting: Cardiology

## 2022-03-24 NOTE — Telephone Encounter (Signed)
[place]-AF study medication added to medication list.

## 2022-03-30 ENCOUNTER — Ambulatory Visit: Payer: Medicare Other | Admitting: Physical Therapy

## 2022-03-30 ENCOUNTER — Encounter: Payer: Self-pay | Admitting: Physical Therapy

## 2022-03-30 DIAGNOSIS — M6281 Muscle weakness (generalized): Secondary | ICD-10-CM

## 2022-03-30 DIAGNOSIS — R2689 Other abnormalities of gait and mobility: Secondary | ICD-10-CM

## 2022-03-30 NOTE — Therapy (Signed)
OUTPATIENT PHYSICAL THERAPY TREATMENT NOTE   Patient Name: Omar Chen MRN: [id number] DOB:08-14-1945, 76 y.o., male Today's Date: 03/30/2022  REFERRING PROVIDER: Kathalene Frames, MD   END OF SESSION:   PT End of Session - 03/30/22 1228     Visit Number 8    Date for PT Re-Evaluation 04/27/22    Authorization Type Medicare Part A/B, KX at visit 15    Progress Note Due on Visit 10    PT Start Time 1230    PT Stop Time 1314    PT Time Calculation (min) 44 min    Activity Tolerance Patient tolerated treatment well    Behavior During Therapy [facility] for tasks assessed/performed                   Past Medical History:  Diagnosis Date   Arrhythmia    Atrial fib post surgery Dec 2022   Chronic kidney disease    Diabetes mellitus without complication ([place])    Hyperlipidemia    Hypertension    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 No pertinent surgical history. 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   Mixed hyperlipidemia 12/06/2013   Shortness of breath 12/06/2013   Thrombocytopenia ([place]) 12/06/2013   PVD (posterior vitreous detachment) 01/09/2013   Hx pulmonary embolism 06/07/1999    REFERRING DIAG: R26.89 (ICD-10-CM) - Other abnormalities of gait and mobility   THERAPY DIAG:  Other abnormalities of gait and mobility  Muscle weakness  (generalized)  Rationale for Evaluation and Treatment Rehabilitation  PERTINENT HISTORY: on anticoagulation, ventral hernia repair developed postoperative complications needing long stay in the hospital and also developed paroxysmal atrial fibrillation underwent cardioversion  ONSET DATE: following post-op complications with ventral hernia repair, needed long stay in hospital and developed a-fib, underwent cardioversion, lost mobility, strength, endurance    PRECAUTIONS: heart  SUBJECTIVE: I am very unsteady today.  It started yesterday when I woke up.  I feel like I'm in a fog some days.      PAIN:    Are you having pain?  No  OBJECTIVE:    DIAGNOSTIC FINDINGS:  N/A     COGNITION:           Overall cognitive status: Within functional limits for tasks assessed                          SENSATION: WFL   MUSCLE LENGTH: Eval: Hamstrings: Right 45 deg; Left 45 deg Limited gluteals and piriformis by 60% bil   POSTURE: decreased lumbar lordosis and flexed trunk    PALPATION: Bil LE edema observed   LUMBAR ROM:    Active  A/PROM  eval A/ROM 10/11  Flexion 30, bends knees 40 deg, bends knees  Extension 5 10  Right lateral flexion 10 10  Left lateral flexion 10 10  Right rotation 30% 50%  Left rotation 30% 50%   (Blank rows = not  tested)   LOWER EXTREMITY ROM:      03/16/22:hip ROM grossly limited 50% bil  Eval: Hip ROM grossly limited 50-75% bil   LOWER EXTREMITY MMT:     03/16/22: grossly 4+/5 bil LE             Eval: Grossly 4+/5 bil LE       FUNCTIONAL TESTS:  03/16/22: 5x sit to stand: 15 sec with hands TUG: 11 sec, short stride length, uses UE to rise and sit from/to chair Berg: 44/56 DGI: 17/24  02/01/22: 5x sit to stand: 14 sec with hands   Evaluation:  5 times sit to stand: 17 sec with hands Timed up and go (TUG): 11 sec Berg Balance Scale: 40/56 Dynamic Gait Index: 12/24   GAIT: Distance walked: within clinic Assistive device  utilized: None Level of assistance: Complete Independence Comments: lacks trunk rotation, wide BOS, flexed trunk       TODAY'S TREATMENT  03/30/22: NuStep L3 x 5' PT present to monitor Chair + pad with feet on pad: 10x sit to stand, pause to find balance each stand LAQ 3lb 2x10 Seated alt march 3lb ankle weights 2x20 Standing bil blue band row 2x20 Standing bil red band shoulder ext 2x 20 Single leg stance with single UE support 3 rounds 15 sec each LE Tandem stance single UE support 3x15 sec each LE Bil heel raise with UE support x 10  03/23/22: NuStep L3 x 4' PT present to monitor Gait training with gait belt: Step to color discs on floor with VC of 1 and 3 color sequences lateral and diag/fwd March taps to tall cone until success with 3 in a row each LE x 1 round, needed VC for improved weight shift into stance leg Stairs with single rail 3x4 6" steps, step through going up, step to coming down Along barre high knee march fwd/bwd x 5 passes, no UE support going fwd, single UE support going bwd, close supervision Seated LAQ 2x10 3lb  Seated march 3lb ankle weights x 20 alt LE Seated hamstring curl red loop band at ankles alt LE x 20    PATIENT EDUCATION:  Education details: Access Code: [email or web address] Person educated: Patient Education method: Explanation, Demonstration, and Handouts Education comprehension: verbalized understanding and returned demonstration     HOME EXERCISE PROGRAM: Access Code: [email or web address] URL: [email or web address] Date: 03/16/2022 Prepared by: Venetia Night Larenda Reedy  Exercises - Seated Hamstring Stretch  - 3 x daily - 7 x weekly - 1 sets - 3 reps - 30 hold - Hooklying Single Knee to Chest Stretch  - 1 x daily - 7 x weekly - 2 sets - 10 reps - Supine Lower Trunk Rotation  - 1 x daily - 7 x weekly - 2 sets - 10 reps - Heel Raises with Counter Support  - 1 x daily - 7 x weekly - 2 sets - 10 reps - Seated Heel Toe Raises  - 3 x daily - 7 x weekly -  1 sets - 20 reps - Standing Hip Abduction with Counter Support  - 1 x daily - 7 x weekly - 3 sets - 10 reps - Standing Hip Extension with Counter Support  - 1 x daily - 7 x weekly - 3 sets - 10 reps - Seated Long Arc Quad  - 1 x daily - 7 x weekly - 2 sets - 10 reps - Sit to Stand  - 1 x daily - 7 x weekly - 3 sets - 5 reps -  Standing Row with Anchored Resistance  - 1 x daily - 7 x weekly - 1 sets - 20 reps - Standing Shoulder Extension with Resistance  - 1 x daily - 7 x weekly - 1 sets - 20 reps - Standing March with Counter Support  - 1 x daily - 7 x weekly - 1 sets - 20 reps - Standing Tandem Balance with Unilateral Counter Support  - 1 x daily - 7 x weekly - 1 sets - 3 reps - 15 sec hold - Standing Single Leg Stance with Unilateral Counter Support  - 1 x daily - 7 x weekly - 1 sets - 10 reps - 15 sec hold  ASSESSMENT:   CLINICAL IMPRESSION: Pt reports feeling unsteady on arrival.  He was able to fully participate in session and reports he has some days where he feels steady.  He has improved in his stability and balance with SLS and stagger stance holds.  He was able to stand on airex pad for sit to stands today but felt too unsteady on airex for UE tband therex.  Continue along POC.   OBJECTIVE IMPAIRMENTS Abnormal gait, decreased activity tolerance, decreased balance, decreased coordination, decreased mobility, decreased ROM, decreased strength, hypomobility, impaired flexibility, and postural dysfunction.    ACTIVITY LIMITATIONS bending, squatting, stairs, transfers, bed mobility, and locomotion level   PARTICIPATION LIMITATIONS: cleaning, driving, shopping, community activity, and yard work   [place] Age and 1-2 comorbidities: 3x abdominal hernia repair, Hx of a-fib with cardioversion, bil LE edema, Hx of pulmonary embolis  are also affecting patient's functional outcome.    REHAB POTENTIAL: Good   CLINICAL DECISION MAKING: Stable/uncomplicated   EVALUATION COMPLEXITY:  Low     GOALS: Goals reviewed with patient? Yes   SHORT TERM GOALS: Target date: 01/11/2022   Pt will be ind with HEP  Baseline: Goal status: MET   2.  Pt will improve LE flexibility to [facility] to allow for greater ease with bending and bed mobility Baseline:  Goal status: MET       LONG TERM GOALS: Target date: 02/08/22   Pt will be ind with advanced HEP and understand importance of compliance Baseline:  Goal status: ONGOING   2.  Pt will improve BERG balance test to at least 48/56 to demo improved safety Baseline: 44/56 on 10/11 Goal status: ONGOING   3.  Pt will improve DGI to at least 18/24 to demo improved safety with dynamic gait tasks. Baseline: 17/24 on 10/11 Goal status: ONGOING   4.  Pt will improve 5x sit to stand to </= 15 sec without use of hands to demo reduced fall risk. Baseline: 14 sec on 8/29 Goal status: met         PLAN: PT FREQUENCY: 1-2x/week   PT DURATION: 8 weeks   PLANNED INTERVENTIONS: Therapeutic exercises, Therapeutic activity, Neuromuscular re-education, Balance training, Gait training, Patient/Family education, Joint mobilization, Electrical stimulation, Spinal mobilization, and Manual therapy.   PLAN FOR NEXT SESSION:  NuStep, outdoor ambulation for hill work, hurdles in parallel bars, functional mobility, strength, balance (static and dynamic), HEP that can be done within 15 min per Pt request for compliance     Ryker Sudbury, PT 03/30/22 1:18 PM

## 2022-04-06 ENCOUNTER — Ambulatory Visit: Payer: Medicare Other | Admitting: Physical Therapy

## 2022-04-06 NOTE — Therapy (Incomplete)
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  Rationale for Evaluation and Treatment Rehabilitation  PERTINENT HISTORY: on anticoagulation, ventral hernia repair developed postoperative complications needing long stay in the hospital and also developed paroxysmal atrial fibrillation underwent cardioversion  ONSET DATE: following post-op complications with ventral hernia repair, needed long stay in hospital and developed a-fib, underwent cardioversion, lost mobility, strength, endurance     PRECAUTIONS: heart  SUBJECTIVE: I am very unsteady today.  It started yesterday when I woke up.  I feel like I'm in a fog some days.      PAIN:    Are you having pain?  No  OBJECTIVE:    DIAGNOSTIC FINDINGS:  N/A     COGNITION:           Overall cognitive status: Within functional limits for tasks assessed                          SENSATION: WFL   MUSCLE LENGTH: Eval: Hamstrings: Right 45 deg; Left 45 deg Limited gluteals and piriformis by 60% bil   POSTURE: decreased lumbar lordosis and flexed trunk    PALPATION: Bil LE edema observed   LUMBAR ROM:    Active  A/PROM  eval A/ROM 10/11  Flexion 30, bends knees 40 deg, bends knees  Extension 5 10  Right lateral flexion 10 10  Left lateral flexion 10 10  Right rotation 30% 50%  Left rotation 30% 50%   (Blank rows = not tested)   LOWER EXTREMITY ROM:      03/16/22:hip ROM grossly limited 50% bil  Eval: Hip ROM grossly limited 50-75% bil   LOWER EXTREMITY MMT:     03/16/22: grossly 4+/5 bil LE             Eval: Grossly 4+/5 bil LE       FUNCTIONAL TESTS:  03/16/22: 5x sit to stand: 15 sec with hands TUG: 11 sec, short stride length, uses UE to rise and sit from/to chair Berg: 44/56 DGI: 17/24  02/01/22: 5x sit to stand: 14 sec with hands  Evaluation:  5 times sit to stand: 17 sec with hands Timed up and go (TUG): 11 sec Berg Balance Scale: 40/56 Dynamic Gait Index: 12/24   GAIT: Distance walked: within clinic Assistive device utilized: None Level of assistance: Complete Independence Comments: lacks trunk rotation, wide BOS, flexed trunk       TODAY'S TREATMENT  04/06/22:   03/30/22: NuStep L3 x 5' PT present to monitor Chair + pad with feet on pad: 10x sit to stand, pause to find balance each stand LAQ 3lb 2x10 Seated alt march 3lb ankle weights 2x20 Standing bil blue band row 2x20 Standing bil red band shoulder ext 2x 20 Single leg stance with single UE support 3 rounds 15  sec each LE Tandem stance single UE support 3x15 sec each LE Bil heel raise with UE support x 10  03/23/22: NuStep L3 x 4' PT present to monitor Gait training with gait belt: Step to color discs on floor with VC of 1 and 3 color sequences lateral and diag/fwd March taps to tall cone until success with 3 in a row each LE x 1 round, needed VC for improved weight shift into stance leg Stairs with single rail 3x4 6" steps, step through going up, step to coming down Along barre high knee march fwd/bwd x 5 passes, no UE support going fwd, single UE support going bwd, close supervision Seated LAQ 2x10 3lb  Seated march 3lb ankle weights x 20 alt LE Seated hamstring curl red loop band at ankles alt LE x 20    PATIENT EDUCATION:  Education details: Access Code: [email or web address] Person educated: Patient Education method: Explanation, Demonstration, and Handouts Education comprehension: verbalized understanding and returned demonstration     HOME EXERCISE PROGRAM: Access Code: [email or web address] URL: [email or web address] Date: 03/16/2022 Prepared by: Venetia Night Merideth Bosque  Exercises - Seated Hamstring Stretch  - 3 x daily - 7 x weekly - 1 sets - 3 reps - 30 hold - Hooklying Single Knee to Chest Stretch  - 1 x daily - 7 x weekly - 2 sets - 10 reps - Supine Lower Trunk Rotation  - 1 x daily - 7 x weekly - 2 sets - 10 reps - Heel Raises with Counter Support  - 1 x daily - 7 x weekly - 2 sets - 10 reps - Seated Heel Toe Raises  - 3 x daily - 7 x weekly - 1 sets - 20 reps - Standing Hip Abduction with Counter Support  - 1 x daily - 7 x weekly - 3 sets - 10 reps - Standing Hip Extension with Counter Support  - 1 x daily - 7 x weekly - 3 sets - 10 reps - Seated Long Arc Quad  - 1 x daily - 7 x weekly - 2 sets - 10 reps - Sit to Stand  - 1 x daily - 7 x weekly - 3 sets - 5 reps - Standing Row with Anchored Resistance  - 1 x daily - 7 x weekly - 1 sets - 20 reps - Standing Shoulder Extension with  Resistance  - 1 x daily - 7 x weekly - 1 sets - 20 reps - Standing March with Counter Support  - 1 x daily - 7 x weekly - 1 sets - 20 reps - Standing Tandem Balance with Unilateral Counter Support  - 1 x daily - 7 x weekly - 1 sets - 3 reps - 15 sec hold - Standing Single Leg Stance with Unilateral Counter Support  -  1 x daily - 7 x weekly - 1 sets - 10 reps - 15 sec hold  ASSESSMENT:   CLINICAL IMPRESSION: Pt reports feeling unsteady on arrival.  He was able to fully participate in session and reports he has some days where he feels steady.  He has improved in his stability and balance with SLS and stagger stance holds.  He was able to stand on airex pad for sit to stands today but felt too unsteady on airex for UE tband therex.  Continue along POC.   OBJECTIVE IMPAIRMENTS Abnormal gait, decreased activity tolerance, decreased balance, decreased coordination, decreased mobility, decreased ROM, decreased strength, hypomobility, impaired flexibility, and postural dysfunction.    ACTIVITY LIMITATIONS bending, squatting, stairs, transfers, bed mobility, and locomotion level   PARTICIPATION LIMITATIONS: cleaning, driving, shopping, community activity, and yard work   [place] Age and 1-2 comorbidities: 3x abdominal hernia repair, Hx of a-fib with cardioversion, bil LE edema, Hx of pulmonary embolis  are also affecting patient's functional outcome.    REHAB POTENTIAL: Good   CLINICAL DECISION MAKING: Stable/uncomplicated   EVALUATION COMPLEXITY: Low     GOALS: Goals reviewed with patient? Yes   SHORT TERM GOALS: Target date: 01/11/2022   Pt will be ind with HEP  Baseline: Goal status: MET   2.  Pt will improve LE flexibility to [facility] to allow for greater ease with bending and bed mobility Baseline:  Goal status: MET       LONG TERM GOALS: Target date: 02/08/22   Pt will be ind with advanced HEP and understand importance of compliance Baseline:  Goal status: ONGOING   2.  Pt  will improve BERG balance test to at least 48/56 to demo improved safety Baseline: 44/56 on 10/11 Goal status: ONGOING   3.  Pt will improve DGI to at least 18/24 to demo improved safety with dynamic gait tasks. Baseline: 17/24 on 10/11 Goal status: ONGOING   4.  Pt will improve 5x sit to stand to </= 15 sec without use of hands to demo reduced fall risk. Baseline: 14 sec on 8/29 Goal status: met         PLAN: PT FREQUENCY: 1-2x/week   PT DURATION: 8 weeks   PLANNED INTERVENTIONS: Therapeutic exercises, Therapeutic activity, Neuromuscular re-education, Balance training, Gait training, Patient/Family education, Joint mobilization, Electrical stimulation, Spinal mobilization, and Manual therapy.   PLAN FOR NEXT SESSION:  NuStep, outdoor ambulation for hill work, hurdles in parallel bars, functional mobility, strength, balance (static and dynamic), HEP that can be done within 15 min per Pt request for compliance     Seline Enzor, PT 04/06/22 7:47 AM

## 2022-04-14 ENCOUNTER — Encounter: Payer: Self-pay | Admitting: Physical Therapy

## 2022-04-14 ENCOUNTER — Ambulatory Visit: Payer: Medicare Other | Attending: Internal Medicine | Admitting: Physical Therapy

## 2022-04-14 DIAGNOSIS — M6281 Muscle weakness (generalized): Secondary | ICD-10-CM | POA: Insufficient documentation

## 2022-04-14 DIAGNOSIS — R2689 Other abnormalities of gait and mobility: Secondary | ICD-10-CM | POA: Diagnosis not present

## 2022-04-14 NOTE — Therapy (Signed)
OUTPATIENT PHYSICAL THERAPY TREATMENT NOTE   Patient Name: Oryn Casanova MRN: [id number] DOB:31-May-1946, 76 y.o., male Today's Date: 04/14/2022  REFERRING PROVIDER: Kathalene Frames, MD   END OF SESSION:   PT End of Session - 04/14/22 1145     Visit Number 9    Date for PT Re-Evaluation 04/27/22    Authorization Type Medicare Part A/B, KX at visit 15    Progress Note Due on Visit 10    PT Start Time 3532    PT Stop Time 1224    PT Time Calculation (min) 39 min    Activity Tolerance Patient tolerated treatment well    Behavior During Therapy [facility] for tasks assessed/performed                    Past Medical History:  Diagnosis Date   Arrhythmia    Atrial fib post surgery Dec 2022   Chronic kidney disease    Diabetes mellitus without complication ([place])    Hyperlipidemia    Hypertension    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 No pertinent surgical history. 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   Mixed hyperlipidemia 12/06/2013   Shortness of breath 12/06/2013   Thrombocytopenia ([place]) 12/06/2013   PVD (posterior vitreous detachment) 01/09/2013   Hx pulmonary embolism 06/07/1999    REFERRING DIAG: R26.89 (ICD-10-CM) - Other abnormalities of gait and mobility   THERAPY DIAG:  Other abnormalities of gait and mobility  Muscle weakness  (generalized)  Rationale for Evaluation and Treatment Rehabilitation  PERTINENT HISTORY: on anticoagulation, ventral hernia repair developed postoperative complications needing long stay in the hospital and also developed paroxysmal atrial fibrillation underwent cardioversion  ONSET DATE: following post-op complications with ventral hernia repair, needed long stay in hospital and developed a-fib, underwent cardioversion, lost mobility, strength, endurance    PRECAUTIONS: heart  SUBJECTIVE: I am very unsteady today.  It started yesterday when I woke up.  I feel like I'm in a fog some days.      PAIN:    Are you having pain?  No  OBJECTIVE:    DIAGNOSTIC FINDINGS:  N/A     COGNITION:           Overall cognitive status: Within functional limits for tasks assessed                          SENSATION: WFL   MUSCLE LENGTH: Eval: Hamstrings: Right 45 deg; Left 45 deg Limited gluteals and piriformis by 60% bil   POSTURE: decreased lumbar lordosis and flexed trunk    PALPATION: Bil LE edema observed   LUMBAR ROM:    Active  A/PROM  eval A/ROM 10/11  Flexion 30, bends knees 40 deg, bends knees  Extension 5 10  Right lateral flexion 10 10  Left lateral flexion 10 10  Right rotation 30% 50%  Left rotation 30% 50%   (Blank rows =  not tested)   LOWER EXTREMITY ROM:      03/16/22:hip ROM grossly limited 50% bil  Eval: Hip ROM grossly limited 50-75% bil   LOWER EXTREMITY MMT:     03/16/22: grossly 4+/5 bil LE             Eval: Grossly 4+/5 bil LE       FUNCTIONAL TESTS:  03/16/22: 5x sit to stand: 15 sec with hands TUG: 11 sec, short stride length, uses UE to rise and sit from/to chair Berg: 44/56 DGI: 17/24  02/01/22: 5x sit to stand: 14 sec with hands   Evaluation:  5 times sit to stand: 17 sec with hands Timed up and go (TUG): 11 sec Berg Balance Scale: 40/56 Dynamic Gait Index: 12/24   GAIT: Distance walked: within clinic Assistive device  utilized: None Level of assistance: Complete Independence Comments: lacks trunk rotation, wide BOS, flexed trunk       TODAY'S TREATMENT  04/14/22: NuStep L3 x 5' PT present to monitor Sit to stand x 10 chair LAQ 3lb 2x10 Standing alt march 3lb ankle weights 2x20, single UE support Standing 1/4 turn footwork at barre to complete full 360s both ways x 3 Standing bil blue band row 2x20 Standing bil red band shoulder ext 2x 20 Single leg stance with single UE support 3 rounds 15 sec each LE Tandem stance single UE support 3x15 sec each LE Gait with VC for 180 deg turns x 10 reps with variable distances between turns Bil heel raise without UE support x 10  03/30/22: NuStep L3 x 5' PT present to monitor Chair + pad with feet on pad: 10x sit to stand, pause to find balance each stand LAQ 3lb 2x10 Seated alt march 3lb ankle weights 2x20 Standing bil blue band row 2x20 Standing bil red band shoulder ext 2x 20 Single leg stance with single UE support 3 rounds 15 sec each LE Tandem stance single UE support 3x15 sec each LE Bil heel raise with UE support x 10  03/23/22: NuStep L3 x 4' PT present to monitor Gait training with gait belt: Step to color discs on floor with VC of 1 and 3 color sequences lateral and diag/fwd March taps to tall cone until success with 3 in a row each LE x 1 round, needed VC for improved weight shift into stance leg Stairs with single rail 3x4 6" steps, step through going up, step to coming down Along barre high knee march fwd/bwd x 5 passes, no UE support going fwd, single UE support going bwd, close supervision Seated LAQ 2x10 3lb  Seated march 3lb ankle weights x 20 alt LE Seated hamstring curl red loop band at ankles alt LE x 20    PATIENT EDUCATION:  Education details: Access Code: [email or web address] Person educated: Patient Education method: Explanation, Demonstration, and Handouts Education comprehension: verbalized understanding and returned demonstration      HOME EXERCISE PROGRAM: Access Code: [email or web address] URL: [email or web address] Date: 03/16/2022 Prepared by: Venetia Night Joseguadalupe Stan  Exercises - Seated Hamstring Stretch  - 3 x daily - 7 x weekly - 1 sets - 3 reps - 30 hold - Hooklying Single Knee to Chest Stretch  - 1 x daily - 7 x weekly - 2 sets - 10 reps - Supine Lower Trunk Rotation  - 1 x daily - 7 x weekly - 2 sets - 10 reps - Heel Raises with Counter Support  - 1 x daily - 7 x weekly - 2 sets - 10 reps -  Seated Heel Toe Raises  - 3 x daily - 7 x weekly - 1 sets - 20 reps - Standing Hip Abduction with Counter Support  - 1 x daily - 7 x weekly - 3 sets - 10 reps - Standing Hip Extension with Counter Support  - 1 x daily - 7 x weekly - 3 sets - 10 reps - Seated Long Arc Quad  - 1 x daily - 7 x weekly - 2 sets - 10 reps - Sit to Stand  - 1 x daily - 7 x weekly - 3 sets - 5 reps - Standing Row with Anchored Resistance  - 1 x daily - 7 x weekly - 1 sets - 20 reps - Standing Shoulder Extension with Resistance  - 1 x daily - 7 x weekly - 1 sets - 20 reps - Standing March with Counter Support  - 1 x daily - 7 x weekly - 1 sets - 20 reps - Standing Tandem Balance with Unilateral Counter Support  - 1 x daily - 7 x weekly - 1 sets - 3 reps - 15 sec hold - Standing Single Leg Stance with Unilateral Counter Support  - 1 x daily - 7 x weekly - 1 sets - 10 reps - 15 sec hold  ASSESSMENT:   CLINICAL IMPRESSION: Pt states he is unsure he is making much progress.  He continues to be unsure of his footing with gait with turns so worked on this via 1/4 turns to complete reps of 360 deg turns and then gait with 180 deg turns on VC today without LOB.  He has been compliant with HEP.  May consider d/c next visit to HEP to work on progress ind for a few months.   OBJECTIVE IMPAIRMENTS Abnormal gait, decreased activity tolerance, decreased balance, decreased coordination, decreased mobility, decreased ROM, decreased strength, hypomobility, impaired  flexibility, and postural dysfunction.    ACTIVITY LIMITATIONS bending, squatting, stairs, transfers, bed mobility, and locomotion level   PARTICIPATION LIMITATIONS: cleaning, driving, shopping, community activity, and yard work   [place] Age and 1-2 comorbidities: 3x abdominal hernia repair, Hx of a-fib with cardioversion, bil LE edema, Hx of pulmonary embolis  are also affecting patient's functional outcome.    REHAB POTENTIAL: Good   CLINICAL DECISION MAKING: Stable/uncomplicated   EVALUATION COMPLEXITY: Low     GOALS: Goals reviewed with patient? Yes   SHORT TERM GOALS: Target date: 01/11/2022   Pt will be ind with HEP  Baseline: Goal status: MET   2.  Pt will improve LE flexibility to [facility] to allow for greater ease with bending and bed mobility Baseline:  Goal status: MET       LONG TERM GOALS: Target date: 02/08/22   Pt will be ind with advanced HEP and understand importance of compliance Baseline:  Goal status: ONGOING   2.  Pt will improve BERG balance test to at least 48/56 to demo improved safety Baseline: 44/56 on 10/11 Goal status: ONGOING   3.  Pt will improve DGI to at least 18/24 to demo improved safety with dynamic gait tasks. Baseline: 17/24 on 10/11 Goal status: ONGOING   4.  Pt will improve 5x sit to stand to </= 15 sec without use of hands to demo reduced fall risk. Baseline: 14 sec on 8/29 Goal status: met         PLAN: PT FREQUENCY: 1-2x/week   PT DURATION: 8 weeks   PLANNED INTERVENTIONS: Therapeutic exercises, Therapeutic activity, Neuromuscular re-education, Balance  training, Gait training, Patient/Family education, Joint mobilization, Electrical stimulation, Spinal mobilization, and Manual therapy.   PLAN FOR NEXT SESSION:  NuStep, outdoor ambulation for hill work, hurdles in parallel bars, functional mobility, strength, balance (static and dynamic), HEP that can be done within 15 min per Pt request for compliance     Itzayanna Kaster, PT 04/14/22 12:33 PM

## 2022-04-20 ENCOUNTER — Encounter: Payer: Self-pay | Admitting: Physical Therapy

## 2022-04-20 ENCOUNTER — Ambulatory Visit: Payer: Medicare Other | Admitting: Physical Therapy

## 2022-04-20 DIAGNOSIS — R2689 Other abnormalities of gait and mobility: Secondary | ICD-10-CM | POA: Diagnosis not present

## 2022-04-20 DIAGNOSIS — M6281 Muscle weakness (generalized): Secondary | ICD-10-CM

## 2022-04-20 NOTE — Therapy (Addendum)
OUTPATIENT PHYSICAL THERAPY TREATMENT NOTE   Patient Name: Omar Chen MRN: [id number] DOB:1945/09/27, 76 y.o., male Today's Date: 04/20/2022  REFERRING PROVIDER: Kathalene Frames, MD   Progress Note Reporting Period 12/14/21 to 04/20/22  See note below for Objective Data and Assessment of Progress/Goals.     END OF SESSION:   PT End of Session - 04/20/22 1144     Visit Number 10    Date for PT Re-Evaluation 04/27/22    Authorization Type Medicare Part A/B, KX at visit 15    Progress Note Due on Visit 20    PT Start Time 0488    PT Stop Time 1215   Pt needed to leave early   PT Time Calculation (min) 30 min    Activity Tolerance Patient tolerated treatment well    Behavior During Therapy [facility] for tasks assessed/performed                     Past Medical History:  Diagnosis Date   Arrhythmia    Atrial fib post surgery Dec 2022   Chronic kidney disease    Diabetes mellitus without complication ([place])    Hyperlipidemia    Hypertension    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 No pertinent surgical history. 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   Mixed hyperlipidemia 12/06/2013   Shortness of breath 12/06/2013   Thrombocytopenia ([place]) 12/06/2013   PVD (posterior vitreous detachment) 01/09/2013   Hx pulmonary embolism 06/07/1999     REFERRING DIAG: R26.89 (ICD-10-CM) - Other abnormalities of gait and mobility   THERAPY DIAG:  Other abnormalities of gait and mobility  Muscle weakness (generalized)  Rationale for Evaluation and Treatment Rehabilitation  PERTINENT HISTORY: on anticoagulation, ventral hernia repair developed postoperative complications needing long stay in the hospital and also developed paroxysmal atrial fibrillation underwent cardioversion  ONSET DATE: following post-op complications with ventral hernia repair, needed long stay in hospital and developed a-fib, underwent cardioversion, lost mobility, strength, endurance    PRECAUTIONS: heart  SUBJECTIVE:  I am realizing how much I need to do my HEP on my own.  Today I feel like my feet have lead in them.  I am also feeling a little shaky today.    PAIN:    Are you having pain?  No  OBJECTIVE:    DIAGNOSTIC FINDINGS:  N/A     COGNITION:           Overall cognitive status: Within functional limits for tasks assessed                          SENSATION: WFL   MUSCLE LENGTH: Eval: Hamstrings: Right 45 deg; Left 45 deg Limited gluteals and piriformis by 60% bil   POSTURE: decreased lumbar lordosis and flexed trunk    PALPATION: Bil LE edema observed   LUMBAR ROM:    Active  A/PROM  eval A/ROM 10/11  Flexion 30, bends knees 40 deg, bends knees  Extension 5 10  Right lateral flexion 10 10  Left lateral flexion 10 10  Right rotation 30% 50%  Left rotation 30% 50%   (Blank rows = not tested)   LOWER EXTREMITY ROM:      03/16/22:hip ROM grossly limited 50% bil  Eval: Hip ROM grossly limited 50-75% bil   LOWER EXTREMITY MMT:     03/16/22: grossly 4+/5 bil LE             Eval: Grossly 4+/5 bil LE       FUNCTIONAL TESTS:  04/20/22: BERG: Pt declined due to not feeling well today DGI: Pt declined due to not feeling well today  03/16/22: 5x sit to stand: 15 sec with hands TUG: 11 sec, short stride length, uses UE to  rise and sit from/to chair Berg: 44/56 DGI: 17/24  02/01/22: 5x sit to stand: 14 sec with hands   Evaluation:  5 times sit to stand: 17 sec with hands Timed up and go (TUG): 11 sec Berg Balance Scale: 40/56 Dynamic Gait Index: 12/24   GAIT: Distance walked: within clinic Assistive device utilized: None Level of assistance: Complete Independence Comments: lacks trunk rotation, wide BOS, flexed trunk       TODAY'S TREATMENT  04/20/22: Bil heel raise x 10 without UE support Standing alt march 3lb ankle weights 2x20, single intermittent UE support Standing hip abd and ext 2x10 3lb ankle weight bil LAQ 3lb 2x10 Sit to stand from chair x 10 Standing 1/4 turn footwork at barre x 10 reps to Rt and Lt each Standing bil blue band row 2x20 Standing bil red band shoulder ext 2x 20  04/14/22: NuStep L3 x 5' PT present to monitor Sit to stand x 10 chair LAQ 3lb 2x10 Standing alt march 3lb ankle weights 2x20, single UE support Standing 1/4 turn footwork at barre to complete full 360s both ways x 3 Standing bil blue band row 2x20 Standing bil red band shoulder ext 2x 20 Single leg stance with single UE support 3 rounds 15 sec each LE Tandem stance single UE support 3x15 sec each LE Gait with VC for 180 deg turns x 10 reps with variable distances between turns Bil heel raise without UE support x 10  03/30/22: NuStep L3 x 5' PT present to monitor Chair + pad with feet on pad: 10x sit to stand, pause to find balance each stand LAQ 3lb 2x10 Seated alt march 3lb ankle weights 2x20 Standing bil blue band row 2x20 Standing bil red band shoulder ext 2x 20 Single leg stance with single UE support 3 rounds 15 sec each LE Tandem stance single UE support 3x15 sec each LE Bil heel raise with UE support x 10     PATIENT EDUCATION:  Education details: Access Code: [email or web address] Person educated: Patient Education method: Explanation, Demonstration, and Handouts Education comprehension:  verbalized understanding and returned demonstration     HOME EXERCISE PROGRAM: Access Code: [email or web address] URL: [email or web address] Date: 03/16/2022 Prepared by: Venetia Night Asahel Risden  Exercises - Seated Hamstring Stretch  - 3 x daily - 7 x weekly - 1 sets - 3 reps - 30 hold - Hooklying Single Knee to Chest Stretch  - 1 x daily - 7 x weekly - 2 sets - 10 reps - Supine Lower Trunk Rotation  - 1 x daily - 7 x weekly - 2 sets - 10 reps - Heel Raises with Counter Support  - 1 x daily -  7 x weekly - 2 sets - 10 reps - Seated Heel Toe Raises  - 3 x daily - 7 x weekly - 1 sets - 20 reps - Standing Hip Abduction with Counter Support  - 1 x daily - 7 x weekly - 3 sets - 10 reps - Standing Hip Extension with Counter Support  - 1 x daily - 7 x weekly - 3 sets - 10 reps - Seated Long Arc Quad  - 1 x daily - 7 x weekly - 2 sets - 10 reps - Sit to Stand  - 1 x daily - 7 x weekly - 3 sets - 5 reps - Standing Row with Anchored Resistance  - 1 x daily - 7 x weekly - 1 sets - 20 reps - Standing Shoulder Extension with Resistance  - 1 x daily - 7 x weekly - 1 sets - 20 reps - Standing March with Counter Support  - 1 x daily - 7 x weekly - 1 sets - 20 reps - Standing Tandem Balance with Unilateral Counter Support  - 1 x daily - 7 x weekly - 1 sets - 3 reps - 15 sec hold - Standing Single Leg Stance with Unilateral Counter Support  - 1 x daily - 7 x weekly - 1 sets - 10 reps - 15 sec hold  ASSESSMENT:   CLINICAL IMPRESSION: Pt requests d/c from PT for now to alleviate stress on his wife who drives him to appointments through the end of the year.  He hopes to restart PT early 2024 as he feels PT has really helped him to date with his gait confidence and balance.  He was feeling unwell so declined retest on DGI and BERG today but had previously shown progress with these tests to at least partially meet goals.  He has been able to progress resistance, reps and exercise challenges with good tolerance.  PT  has worked on gait with footwork turns for stability and coordination.  Pt has HEP and understands importance of compliance to maintain gains.    OBJECTIVE IMPAIRMENTS Abnormal gait, decreased activity tolerance, decreased balance, decreased coordination, decreased mobility, decreased ROM, decreased strength, hypomobility, impaired flexibility, and postural dysfunction.    ACTIVITY LIMITATIONS bending, squatting, stairs, transfers, bed mobility, and locomotion level   PARTICIPATION LIMITATIONS: cleaning, driving, shopping, community activity, and yard work   [place] Age and 1-2 comorbidities: 3x abdominal hernia repair, Hx of a-fib with cardioversion, bil LE edema, Hx of pulmonary embolis  are also affecting patient's functional outcome.    REHAB POTENTIAL: Good   CLINICAL DECISION MAKING: Stable/uncomplicated   EVALUATION COMPLEXITY: Low     GOALS: Goals reviewed with patient? Yes   SHORT TERM GOALS: Target date: 01/11/2022   Pt will be ind with HEP  Baseline: Goal status: MET   2.  Pt will improve LE flexibility to [facility] to allow for greater ease with bending and bed mobility Baseline:  Goal status: MET       LONG TERM GOALS: Target date: 02/08/22   Pt will be ind with advanced HEP and understand importance of compliance Baseline:  Goal status: met   2.  Pt will improve BERG balance test to at least 48/56 to demo improved safety Baseline: 44/56 on 10/11 Goal status: partially met   3.  Pt will improve DGI to at least 18/24 to demo improved safety with dynamic gait tasks. Baseline: 17/24 on 10/11 Goal status: partially met   4.  Pt will  improve 5x sit to stand to </= 15 sec without use of hands to demo reduced fall risk. Baseline: 14 sec on 8/29 Goal status: met         PLAN: PT FREQUENCY: 1-2x/week   PT DURATION: 8 weeks   PLANNED INTERVENTIONS: Therapeutic exercises, Therapeutic activity, Neuromuscular re-education, Balance training, Gait training,  Patient/Family education, Joint mobilization, Electrical stimulation, Spinal mobilization, and Manual therapy.   PLAN FOR NEXT SESSION:  d/c to HEP at this time    PHYSICAL THERAPY DISCHARGE SUMMARY  Visits from Start of Care: 10  Current functional level related to goals / functional outcomes: Pt met most goals.  Needs to d/c PT at this time due to transportation issues.   Remaining deficits: See above   Education / Equipment: HEP   Patient agrees to discharge. Patient goals were partially met. Patient is being discharged due to being pleased with the current functional level.   Chistian Kasler, PT 04/20/22 12:16 PM    Amely Voorheis, PT 04/20/22 1:49 PM

## 2022-04-22 ENCOUNTER — Other Ambulatory Visit: Payer: Self-pay | Admitting: Cardiology

## 2022-04-22 ENCOUNTER — Other Ambulatory Visit: Payer: Self-pay

## 2022-04-22 DIAGNOSIS — I10 Essential (primary) hypertension: Secondary | ICD-10-CM

## 2022-04-22 MED ORDER — FARXIGA 10 MG PO TABS: 10.0000 mg | ORAL_TABLET | Freq: Every day | ORAL | 2 refills | Status: DC

## 2022-05-10 ENCOUNTER — Telehealth: Payer: Self-pay | Admitting: Pharmacist

## 2022-05-10 ENCOUNTER — Other Ambulatory Visit: Payer: Self-pay | Admitting: Cardiology

## 2022-05-10 DIAGNOSIS — I480 Paroxysmal atrial fibrillation: Secondary | ICD-10-CM

## 2022-05-10 MED ORDER — RIVAROXABAN 15 MG PO TABS: 15.0000 mg | ORAL_TABLET | Freq: Every day | ORAL | 1 refills | Status: DC

## 2022-05-10 NOTE — Telephone Encounter (Signed)
Chief Complaint  Patient presents with   Medication Refill   Research    To stop OCEANIC-AF (Asundexian - factor XIa inhibitor PO BID vs Apixaban PO BID in patients with A. Fib for stroke prevention.       ICD-10-CM   1. Paroxysmal atrial fibrillation (HCC)  I48.0      Xarelto Rx sent to mail order pharmacy 90 tab with 1 refill.   Yates Decamp, MD, [facility] 05/10/2022, 7:02 PM Office: [phone] Fax: [phone] Pager: [phone]

## 2022-05-10 NOTE — Telephone Encounter (Signed)
Mr. Perz was on Eliquis and then given samples of Xarelto just before entering into the [facility] AF trial. Per his wife, he did not start the Xarelto. They still have the Xarelto samples and will start that on 05/24/22 when he has his end of treatment visit here at the clinic. If that is still the medication you intend for him to be on, please send an Rx. Please advise me on what you decide so that we can communicate to his wife by mid-next week.

## 2022-06-08 NOTE — Progress Notes (Unsigned)
Primary Physician/Referring:  Kathalene Frames, MD  Patient ID: Omar Chen, male    DOB: 1945/11/12, 77 y.o.   MRN: [id number]  No chief complaint on file.   HPI:    Omar Chen  is a 77 y.o. 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  I suspect his atrial fibrillation episode was probably related to postop complications.  However due to high CHA2DS2-VASc score, he is continued on anticoagulation.  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  This is a 3-monthfollow-up.  Dyspnea has remained stable.  No PND or orthopnea.  Denies chest pain or palpitations.     *** Lost about 25 pounds of weight to 30 pounds in the past 6 months, additional 3 pounds since her last saw him 6 weeks ago, wife is present..  Past Medical History:  Diagnosis Date   Arrhythmia    Atrial fib post surgery Dec 2022   Chronic kidney disease    Di[place]    Hyperlipidemia    Hypertension    [place] 02/[place]   Morbid [place] ([facility] 02/21/2022   Postprocedural intraabdominal abscess 02/21/2022   No past surgical history on file. Family History  Problem Relation Age of Onset   Rheum arthritis Mother    Cancer - Lung Father    Pancreatic cancer Sister     Social History   Tobacco Use   Smoking status: Never   Smokeless tobacco: Never  Substance Use Topics   Alcohol use: Yes    Alcohol/week: 1.0 standard drink of alcohol    Types: 1 Standard drinks or equivalent per week   Marital Status:    ROS  Review of Systems  Cardiovascular:  Positive for dyspnea on exertion and leg swelling. Negative for chest pain.   Objective  There were no vitals taken for  this visit. There is no height or weight on file to calculate BMI.     02/21/2022    1:47 PM 12/06/2021   10:41 AM 12/06/2021   10:28 AM  Vitals with BMI  Height _0   _1   Weight 246 lbs 13 oz  254 lbs 13 oz  BMI 354.65 303.54 Systolic [id number] Diastolic 85 78 81  Pulse 67 59 65    There were no vitals filed for this visit.    Physical Exam Neck:     Vascular: No JVD.  Cardiovascular:     Rate and Rhythm: Normal rate and regular rhythm.     Pulses: Intact distal pulses.          Carotid pulses are 2+ on the right side and 2+ on the left side.      Popliteal pulses are 2+ on the right side and 2+ on the left side.       Dorsalis pedis pulses are 1+ on the right side and 0 on the left side.       Posterior tibial pulses are 2+ on the right side and 0 on the left side.     Heart sounds: Normal heart sounds. No murmur heard.    No gallop.  Pulmonary:     Effort: Pulmonary effort is normal.     Breath sounds: Normal breath sounds. No  rales.  Abdominal:     General: Bowel sounds are normal.     Palpations: Abdomen is soft.     Comments: Obese. Pannus present  Musculoskeletal:     Right lower leg: Edema (Non pitting edema with chronic venostasis dermatitis) present.     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     Medications and allergies  No Known Allergies   Medication list after today's encounter   Current Outpatient Medications:    amlodipine-atorvastatin (CADUET) 10-20 MG tablet, Take 1 tablet by mouth daily., Disp: , Rfl:    atorvastatin (LIPITOR) 40 MG tablet, Take 40 mg by mouth daily., Disp: , Rfl:    FARXIGA 10 MG TABS tablet, Take 1 tablet (10 mg total) by mouth daily., Disp: 90 tablet, Rfl: 2   fluticasone (FLONASE) 50 MCG/ACT nasal spray, Place 1-2 sprays into the nose as needed for allergies., Disp: , Rfl:    furosemide (LASIX) 20 MG tablet, Take 20 mg by mouth., Disp: , Rfl:    hydrALAZINE (APRESOLINE) 25 MG  tablet, Take 25 mg by mouth 2 (two) times daily., Disp: , Rfl:    isosorbide dinitrate (ISORDIL) 30 MG tablet, TAKE 1 TABLET BY MOUTH 3 TIMES DAILY., Disp: 270 tablet, Rfl: 1   metoprolol succinate (TOPROL-XL) 50 MG 24 hr tablet, 1 tablet DAILY (route: oral), Disp: , Rfl:    olopatadine (PATANOL) 0.1 % ophthalmic solution, Apply 1-2 drops to eye., Disp: , Rfl:    Rivaroxaban (XARELTO) 15 MG TABS tablet, Take 1 tablet (15 mg total) by mouth daily with supper., Disp: 90 tablet, Rfl: 1   Laboratory examination:   Lab Results  Component Value Date   NA 139 02/21/2022   K 3.7 02/21/2022   CO2 28 02/21/2022   GLUCOSE 107 (H) 02/21/2022   BUN 24 02/21/2022   CREATININE 1.66 (H) 02/21/2022   CALCIUM 9.0 02/21/2022   EGFR 43 (L) 02/21/2022       Latest Ref Rng & Units 02/21/2022    2:39 PM  CMP  Glucose 65 - 99 mg/dL 107   BUN 7 - 25 mg/dL 24   Creatinine 0.70 - 1.28 mg/dL 1.66   Sodium 135 - 146 mmol/L 139   Potassium 3.5 - 5.3 mmol/L 3.7   Chloride 98 - 110 mmol/L 102   CO2 20 - 32 mmol/L 28   Calcium 8.6 - 10.3 mg/dL 9.0   Total Protein 6.1 - 8.1 g/dL 6.5   Total Bilirubin 0.2 - 1.2 mg/dL 1.0   AST 10 - 35 U/L 8   ALT 9 - 46 U/L 10         Latest Ref Rng & Units 02/21/2022    2:39 PM 08/16/2021   10:32 AM  CBC  WBC 3.8 - 10.8 Thousand/uL 5.8  8.5   Hemoglobin 13.2 - 17.1 g/dL 13.4  12.3   Hematocrit 38.5 - 50.0 % 39.3  36.4   Platelets 140 - 400 Thousand/uL 126  183    Lipid Panel Recent Labs    08/16/21 1032  CHOL 157  TRIG 99  LDLCALC 87  HDL 52   Lipid Panel     Component Value Date/Time   CHOL 157 08/16/2021 1032   TRIG 99 08/16/2021 1032   HDL 52 08/16/2021 1032   LDLCALC 87 08/16/2021 1032   LABVLDL 18 08/16/2021 1032     HEMOGLOBIN A1C Lab Results  Component Value Date   HGBA1C 5.9 (H) 08/16/2021  TSH Recent Labs    08/16/21 1032  TSH 3.580   ProBNP (last 3 results) Recent Labs    08/16/21 1032  PROBNP 662*    External labs:   A1C  5.900 % 01/25/2022  Hemoglobin 13.400 g/d 02/21/2022 Platelets 126.000 Th 02/21/2022  Creatinine, Serum 1.660 mg/ 02/21/2022 Potassium 3.700 mm 02/21/2022 ALT (SGPT) 10.000 U/L 02/21/2022  Radiology:   Chest x-ray PA and lateral view 08/16/2021: The heart and mediastinal contours are within normal limits. Aortic calcification. Slightly prominent hilar vasculature. No focal consolidation. No pulmonary edema. No pleural effusion. No pneumothorax.  Cardiac Studies:   TTE 03/27/2021:  Normal LV systolic function, EF 55 to 60%.  Normal RV systolic function.  No pericardial effusion.  Left atrium is moderately enlarged.   TEE guided direct-current cardioversion 03/29/2021:  Normal TEE with normal RV systolic function.  No thrombus in the left atrial appendage.  Successful direct-current cardioversion with 300 J x 1 from atrial fibrillation to normal sinus rhythm.  PCV MYOCARDIAL PERFUSION WITH LEXISCAN 09/22/2021  Lexiscan/modified Bruce nuclear stress test performed using 1-day protocol. SPECT images show decreased tracer uptake in inferior myocardium, likely related to diaphragmatic attenuation. No definitive evidence of ischemia seen. Stress LVEF 77%. Low risk study.    EKG:  EKG 12/26/2021: Sinus rhythm with first-degree AV block at rate of 58 bpm, left atrial enlargement, nonspecific T abnormality.  No significant change from 09/06/2021.   EKG 03/26/2021: Atrial fibrillation with controlled ventricular response at rate of 89 bpm, normal axis, nonspecific T abnormality, cannot exclude lateral ischemia.  Abnormal EKG.    Assessment     ICD-10-CM   1. Paroxysmal atrial fibrillation (HCC)  I48.0     2. Primary hypertension  I10     3. Chronic diastolic (congestive) heart failure (HCC)  I50.32       CHA2DS2-VASc Score is 4.  Yearly risk of stroke: 4.8% (A, HTN, CHF, DM).  Score of 1=0.6; 2=2.2; 3=3.2; 4=4.8; 5=7.2; 6=9.8; 7=>9.8) -(CHF; HTN; vasc disease DM,  Male = 1; Age <65 =0;  65-74 = 1,  >75 =2; stroke/embolism= 2).    There are no discontinued medications.    No orders of the defined types were placed in this encounter.  No orders of the defined types were placed in this encounter.  Recommendations:   Omar Chen is a 77 y.o.  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  I suspect his atrial fibrillation episode was probably related to postop complications.  However due to high CHA2DS2-VASc score, he is continued on anticoagulation.  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  This is a 76-monthfollow-up.  Dyspnea has remained stable.  No PND or orthopnea.  Denies chest pain or palpitations.    ***  1. Paroxysmal atrial fibrillation (HCC) ***  2. Primary hypertension ***  3. Chronic diastolic (congestive) heart failure (HCC) ***  He is having difficulty with taking isosorbide dinitrate and also hydralazine 3 times daily, often missing afternoon doses.  As he continues to lose weight and has made lifestyle changes, blood pressure is also improved, will change it to twice daily dosing however will increase hydralazine from 25 mg to 50 mg.  Otherwise stable from cardiac standpoint, I will see him back in 6 months or sooner if  problems.   In spite of diagnosis of heart failure, he is not on an ACE inhibitor or an ARB due to stage IIIb chronic kidney disease.    Adrian Prows, MD, [facility] 06/08/2022, 10:25 PM Office: [phone]

## 2022-06-09 ENCOUNTER — Encounter: Payer: Self-pay | Admitting: Cardiology

## 2022-06-09 ENCOUNTER — Ambulatory Visit: Payer: Medicare Other | Admitting: Cardiology

## 2022-06-09 VITALS — BP 145/80 | HR 67 | Resp 94 | Ht 73.0 in | Wt 243.4 lb

## 2022-06-09 DIAGNOSIS — I5032 Chronic diastolic (congestive) heart failure: Secondary | ICD-10-CM

## 2022-06-09 DIAGNOSIS — E6609 Other obesity due to excess calories: Secondary | ICD-10-CM

## 2022-06-09 DIAGNOSIS — I480 Paroxysmal atrial fibrillation: Secondary | ICD-10-CM

## 2022-06-09 DIAGNOSIS — I10 Essential (primary) hypertension: Secondary | ICD-10-CM

## 2022-06-09 MED ORDER — HYDRALAZINE HCL 50 MG PO TABS: 50.0000 mg | ORAL_TABLET | Freq: Two times a day (BID) | ORAL | 3 refills | Status: DC

## 2022-06-21 ENCOUNTER — Other Ambulatory Visit: Payer: Self-pay

## 2022-06-21 DIAGNOSIS — I10 Essential (primary) hypertension: Secondary | ICD-10-CM

## 2022-06-21 MED ORDER — HYDRALAZINE HCL 50 MG PO TABS: 50.0000 mg | ORAL_TABLET | Freq: Two times a day (BID) | ORAL | 3 refills | Status: DC

## 2022-07-25 ENCOUNTER — Other Ambulatory Visit: Payer: Self-pay

## 2022-07-25 MED ORDER — FARXIGA 10 MG PO TABS: 10.0000 mg | ORAL_TABLET | Freq: Every day | ORAL | 0 refills | Status: DC

## 2022-08-29 ENCOUNTER — Other Ambulatory Visit: Payer: Self-pay

## 2022-08-29 DIAGNOSIS — I10 Essential (primary) hypertension: Secondary | ICD-10-CM

## 2022-08-29 DIAGNOSIS — I480 Paroxysmal atrial fibrillation: Secondary | ICD-10-CM

## 2022-08-29 MED ORDER — RIVAROXABAN 15 MG PO TABS: 15.0000 mg | ORAL_TABLET | Freq: Every day | ORAL | 3 refills | Status: DC

## 2022-08-29 MED ORDER — HYDRALAZINE HCL 50 MG PO TABS: 50.0000 mg | ORAL_TABLET | Freq: Two times a day (BID) | ORAL | 3 refills | Status: DC

## 2022-09-16 ENCOUNTER — Other Ambulatory Visit: Payer: Self-pay | Admitting: General Surgery

## 2022-09-16 DIAGNOSIS — T8579XA Infection and inflammatory reaction due to other internal prosthetic devices, implants and grafts, initial encounter: Secondary | ICD-10-CM

## 2022-10-17 ENCOUNTER — Other Ambulatory Visit: Payer: Self-pay | Admitting: Cardiology

## 2022-10-17 ENCOUNTER — Ambulatory Visit
Admission: RE | Admit: 2022-10-17 | Discharge: 2022-10-17 | Disposition: A | Discharge status: Home / Self Care | Payer: Medicare Other | Source: Ambulatory Visit | Attending: General Surgery | Admitting: General Surgery

## 2022-10-17 DIAGNOSIS — T8579XA Infection and inflammatory reaction due to other internal prosthetic devices, implants and grafts, initial encounter: Secondary | ICD-10-CM

## 2022-12-12 ENCOUNTER — Ambulatory Visit: Payer: Medicare Other | Admitting: Cardiology

## 2022-12-12 ENCOUNTER — Encounter: Payer: Self-pay | Admitting: Cardiology

## 2022-12-12 VITALS — BP 126/76 | HR 64 | Resp 16 | Ht 73.0 in | Wt 252.4 lb

## 2022-12-12 DIAGNOSIS — I480 Paroxysmal atrial fibrillation: Secondary | ICD-10-CM

## 2022-12-12 DIAGNOSIS — I10 Essential (primary) hypertension: Secondary | ICD-10-CM

## 2022-12-12 DIAGNOSIS — I5032 Chronic diastolic (congestive) heart failure: Secondary | ICD-10-CM

## 2022-12-12 NOTE — Progress Notes (Signed)
Primary Physician/Referring:  Emilio Aspen, MD  Patient ID: Omar Chen, male    DOB: 1945-12-13, 77 y.o.   MRN: [id number]  Chief Complaint  Patient presents with  . Atrial Fibrillation  . Hypertension  . Follow-up    6 months    HPI:    Omar Chen  is a 77 y.o. 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  However due to high CHA2DS2-VASc score, he is continued on anticoagulation.  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  This is a 69-month follow-up.  Dyspnea has remained stable.  No PND or orthopnea.  Denies chest pain or palpitations.  Overall feeling well, has not used any diuretics in the last 6 months.  He has been extremely careful with his diet.  Wife is present.  Past Medical History:  Diagnosis Date  . Arrhythmia    Atrial fib post surgery Dec 2022  . Chronic kidney disease   . Diabetes mellitus without complication (HCC)   . Hyperlipidemia   . Hypertension   . 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 Morbid obesity (HCC) 02/21/2022  . Postprocedural intraabdominal abscess 02/21/2022   History reviewed. No pertinent surgical history. Family History  Problem Relation Age of Onset  . Rheum arthritis Mother   . Cancer - Lung Father   . Pancreatic cancer Sister     Social History   Tobacco Use  . Smoking status: Never  . Smokeless tobacco: Never  Substance Use Topics  . Alcohol use: Yes    Alcohol/week: 1.0 standard drink of alcohol    Types: 1 Standard drinks or equivalent per week    Comment: occasionally   Marital Status:    ROS  Review of Systems  Cardiovascular:  Positive for dyspnea on exertion (Mild and stable). Negative for chest pain and leg  swelling.   Objective  Blood pressure (!) 154/75, pulse 64, resp. rate 16, height 6\' 1"  (1.854 m), weight 252 lb 6.4 oz (114.5 kg), SpO2 94 %. Body mass index is 33.3 kg/m.     12/12/2022   10:59 AM 06/09/2022   11:16 AM 06/09/2022   11:06 AM  Vitals with BMI  Height 6\' 1"   6\' 1"   Weight 252 lbs 6 oz  243 lbs 6 oz  BMI 33.31  32.12  Systolic 154 145 272  Diastolic 75 80 80  Pulse 64  67    Filed Weights   12/12/22 1059  Weight: 252 lb 6.4 oz (114.5 kg)    Physical Exam Neck:     Vascular: No JVD.  Cardiovascular:     Rate and Rhythm: Normal rate and regular rhythm.     Pulses: Intact distal pulses.          Carotid pulses are 2+ on the right side and 2+ on the left side.      Popliteal pulses are 2+ on the right side and 2+ on the left side.       Dorsalis pedis pulses are 1+ on the right side and 0 on the left side.       Posterior tibial pulses are 2+ on the right side and 0 on the left side.     Heart sounds: Normal heart sounds. No murmur heard.    No  gallop.  Pulmonary:     Effort: Pulmonary effort is normal.     Breath sounds: Normal breath sounds. No rales.  Abdominal:     General: Bowel sounds are normal.     Palpations: Abdomen is soft.     Comments: Obese. Pannus present  Musculoskeletal:     Right lower leg: Edema (Trace pitting edema to ankle.  Chronic venostasis dermatitis) present.     Left lower leg: Edema (Trace pitting edema to ankle. Chronic venostasis dermatitis) present.    Medications and allergies  No Known Allergies   Medication list after today's encounter   Current Outpatient Medications:  .  amLODipine-benazepril (LOTREL) 10-20 MG capsule, Take 1 capsule by mouth daily., Disp: , Rfl:  .  atorvastatin (LIPITOR) 40 MG tablet, Take 40 mg by mouth daily., Disp: , Rfl:  .  FARXIGA 10 MG TABS tablet, TAKE 1 TABLET DAILY, Disp: 90 tablet, Rfl: 3 .  fluticasone (FLONASE) 50 MCG/ACT nasal spray, Place 1-2 sprays into the nose as needed for allergies.,  Disp: , Rfl:  .  furosemide (LASIX) 20 MG tablet, Take 20 mg by mouth., Disp: , Rfl:  .  hydrALAZINE (APRESOLINE) 50 MG tablet, Take 1 tablet (50 mg total) by mouth 2 (two) times daily., Disp: 180 tablet, Rfl: 3 .  isosorbide dinitrate (ISORDIL) 30 MG tablet, TAKE 1 TABLET BY MOUTH 3 TIMES DAILY. (Patient taking differently: Take 30 mg by mouth 2 (two) times daily.), Disp: 270 tablet, Rfl: 1 .  metoprolol succinate (TOPROL-XL) 50 MG 24 hr tablet, Take 25 tablets by mouth daily., Disp: , Rfl:  .  olopatadine (PATANOL) 0.1 % ophthalmic solution, Apply 1-2 drops to eye., Disp: , Rfl:  .  Rivaroxaban (XARELTO) 15 MG TABS tablet, Take 1 tablet (15 mg total) by mouth daily with supper., Disp: 90 tablet, Rfl: 3   Laboratory examination:   Lab Results  Component Value Date   NA 139 02/21/2022   K 3.7 02/21/2022   CO2 28 02/21/2022   GLUCOSE 107 (H) 02/21/2022   BUN 24 02/21/2022   CREATININE 1.66 (H) 02/21/2022   CALCIUM 9.0 02/21/2022   EGFR 43 (L) 02/21/2022       Latest Ref Rng & Units 02/21/2022    2:39 PM  CMP  Glucose 65 - 99 mg/dL 295   BUN 7 - 25 mg/dL 24   Creatinine 6.21 - 1.28 mg/dL 3.08   Sodium 657 - 846 mmol/L 139   Potassium 3.5 - 5.3 mmol/L 3.7   Chloride 98 - 110 mmol/L 102   CO2 20 - 32 mmol/L 28   Calcium 8.6 - 10.3 mg/dL 9.0   Total Protein 6.1 - 8.1 g/dL 6.5   Total Bilirubin 0.2 - 1.2 mg/dL 1.0   AST 10 - 35 U/L 8   ALT 9 - 46 U/L 10         Latest Ref Rng & Units 02/21/2022    2:39 PM 08/16/2021   10:32 AM  CBC  WBC 3.8 - 10.8 Thousand/uL 5.8  8.5   Hemoglobin 13.2 - 17.1 g/dL 96.2  95.2   Hematocrit 38.5 - 50.0 % 39.3  36.4   Platelets 140 - 400 Thousand/uL 126  183    Lipid Panel     Component Value Date/Time   CHOL 157 08/16/2021 1032   TRIG 99 08/16/2021 1032   HDL 52 08/16/2021 1032   LDLCALC 87 08/16/2021 1032   LABVLDL 18 08/16/2021 1032     HEMOGLOBIN  A1C Lab Results  Component Value Date   HGBA1C 5.9 (H) 08/16/2021   Lab Results   Component Value Date   TSH 3.580 08/16/2021    External labs:   Cholesterol, total 130.000 m 08/10/2022 HDL 57.000 mg 08/10/2022 LDL 60.000 mg 08/10/2022 Triglycerides 61.000 mg 08/10/2022  A1C 5.700 % 08/10/2022 TSH 2.300 08/10/2022  Hemoglobin 13.600 g/d 08/10/2022  Creatinine, Serum 1.610 mg/ 08/10/2022 Potassium 4.000 mm 08/10/2022 ALT (SGPT) 7.000 U/L 08/10/2022  Radiology:   Chest x-ray PA and lateral view 08/16/2021: The heart and mediastinal contours are within normal limits. Aortic calcification. Slightly prominent hilar vasculature. No focal consolidation. No pulmonary edema. No pleural effusion. No pneumothorax.  Cardiac Studies:   TTE 03/27/2021:  Normal LV systolic function, EF 55 to 60%.  Normal RV systolic function.  No pericardial effusion.  Left atrium is moderately enlarged.   TEE guided direct-current cardioversion 03/29/2021:  Normal TEE with normal RV systolic function.  No thrombus in the left atrial appendage.  Successful direct-current cardioversion with 300 J x 1 from atrial fibrillation to normal sinus rhythm.  PCV MYOCARDIAL PERFUSION WITH LEXISCAN 09/22/2021  Lexiscan/modified Bruce nuclear stress test performed using 1-day protocol. SPECT images show decreased tracer uptake in inferior myocardium, likely related to diaphragmatic attenuation. No definitive evidence of ischemia seen. Stress LVEF 77%. Low risk study.    EKG:   EKG 12/12/2022: Sinus rhythm with first-degree block at rate of 66 bpm, left atrial enlargement otherwise normal EKG.  Compared to 06/09/2022, no significant change.   EKG 03/26/2021: Atrial fibrillation with controlled ventricular response at rate of 89 bpm, normal axis, nonspecific T abnormality, cannot exclude lateral ischemia.  Abnormal EKG.    Assessment     ICD-10-CM   1. Paroxysmal atrial fibrillation (HCC)  I48.0 EKG 12-Lead    2. Primary hypertension  I10     3. Chronic diastolic (congestive) heart failure (HCC)  I50.32        CHA2DS2-VASc Score is 4.  Yearly risk of stroke: 4.8% (A, HTN, CHF, DM).  Score of 1=0.6; 2=2.2; 3=3.2; 4=4.8; 5=7.2; 6=9.8; 7=>9.8) -(CHF; HTN; vasc disease DM,  Male = 1; Age <65 =0; 65-74 = 1,  >75 =2; stroke/embolism= 2).    There are no discontinued medications.   No orders of the defined types were placed in this encounter.  Orders Placed This Encounter  Procedures  . EKG 12-Lead   Recommendations:   Efrain Bosarge is a 77 y.o.  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  However due to high CHA2DS2-VASc score, he is continued on anticoagulation.  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  1. Paroxysmal atrial fibrillation (HCC) As for 16-month office visit, remains stable and is maintaining sinus rhythm.  Overall he has lost about 50 pounds in weight after he started seeing [place] and felt remarkably well.  States that during the holidays he gained about 5 pounds in weight but he is very motivated to getting the weight back down.  EKG reveals sinus rhythm with first-degree AV block.  No ischemia.  He is tolerating anticoagulation with Xarelto. - EKG 12-Lead  2. Primary hypertension Blood pressure Controlled.  Reviewed his external labs, he does have stage III chronic kidney disease and has remained stable.  3. Chronic diastolic (congestive) heart  failure (HCC) No further clinical evidence of heart failure.  He remains asymptomatic.  I will see him back in 6 months for follow-up.   Yates Decamp, MD, [facility] 12/12/2022, 11:14 AM Office: [phone]

## 2022-12-21 ENCOUNTER — Other Ambulatory Visit: Payer: Self-pay | Admitting: Ophthalmology

## 2022-12-21 DIAGNOSIS — H05243 Constant exophthalmos, bilateral: Secondary | ICD-10-CM

## 2022-12-22 ENCOUNTER — Other Ambulatory Visit: Payer: Self-pay

## 2022-12-22 DIAGNOSIS — I480 Paroxysmal atrial fibrillation: Secondary | ICD-10-CM

## 2022-12-22 MED ORDER — RIVAROXABAN 15 MG PO TABS
15.0000 mg | ORAL_TABLET | Freq: Every day | ORAL | 3 refills | Status: DC
Start: 2022-12-22 — End: 2023-09-14

## 2022-12-29 ENCOUNTER — Other Ambulatory Visit: Payer: Self-pay | Admitting: Cardiology

## 2022-12-29 DIAGNOSIS — I10 Essential (primary) hypertension: Secondary | ICD-10-CM

## 2023-01-11 ENCOUNTER — Ambulatory Visit
Admission: RE | Admit: 2023-01-11 | Discharge: 2023-01-11 | Disposition: A | Discharge status: Home / Self Care | Payer: Medicare Other | Source: Ambulatory Visit | Attending: Ophthalmology | Admitting: Ophthalmology

## 2023-01-11 DIAGNOSIS — H05243 Constant exophthalmos, bilateral: Secondary | ICD-10-CM

## 2023-03-02 ENCOUNTER — Ambulatory Visit: Payer: Medicare Other | Attending: Internal Medicine | Admitting: Physical Therapy

## 2023-03-02 ENCOUNTER — Other Ambulatory Visit: Payer: Self-pay

## 2023-03-02 ENCOUNTER — Encounter: Payer: Self-pay | Admitting: Physical Therapy

## 2023-03-02 DIAGNOSIS — W19XXXS Unspecified fall, sequela: Secondary | ICD-10-CM | POA: Insufficient documentation

## 2023-03-02 DIAGNOSIS — X58XXXS Exposure to other specified factors, sequela: Secondary | ICD-10-CM | POA: Diagnosis not present

## 2023-03-02 DIAGNOSIS — M6281 Muscle weakness (generalized): Secondary | ICD-10-CM | POA: Insufficient documentation

## 2023-03-02 DIAGNOSIS — R2689 Other abnormalities of gait and mobility: Secondary | ICD-10-CM | POA: Insufficient documentation

## 2023-03-02 NOTE — Therapy (Signed)
OUTPATIENT PHYSICAL THERAPY THORACOLUMBAR EVALUATION   Patient Name: Omar Chen MRN: [id number] DOB:January 23, 1946, 77 y.o., male Today's Date: 03/02/2023  END OF SESSION:  PT End of Session - 03/02/23 1312     Visit Number 1    Date for PT Re-Evaluation 04/27/23    Authorization Type MCR Part A/B - KX needed at visit 15    Progress Note Due on Visit 10    PT Start Time 1230    PT Stop Time 1308    PT Time Calculation (min) 38 min    Activity Tolerance Patient tolerated treatment well    Behavior During Therapy [facility] for tasks assessed/performed             Past Medical History:  Diagnosis Date   Arrhythmia    Atrial fib post surgery Dec 2022   Chronic kidney disease    Diabetes mellitus without complication (HCC)    Hyperlipidemia    Hypertension    Infected hernioplasty mesh (HCC) 02/21/2022   Morbid obesity (HCC) 02/21/2022   Postprocedural intraabdominal abscess 02/21/2022   History reviewed. No pertinent surgical history. 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   Mixed hyperlipidemia 12/06/2013   Shortness of breath 12/06/2013   Thrombocytopenia (HCC) 12/06/2013   PVD (posterior vitreous detachment) 01/09/2013   Hx pulmonary embolism 06/07/1999    PCP:   REFERRING PROVIDER: Emilio Aspen, MD  REFERRING DIAG: W19.XXXS (ICD-10-CM) - Unspecified fall, sequela  Rationale for Evaluation and Treatment: Rehabilitation  THERAPY DIAG:  Other abnormalities of gait and mobility  Muscle weakness  (generalized)  ONSET DATE: chronic weakness and balance problems  SUBJECTIVE:                                                                                                                                                                                           SUBJECTIVE STATEMENT: My wife wants me to be here.  I never really got my balance back after we moved and after the abdominal hernia surgery.  I admit I'm not doing much at home.   I did fall from a stool onto the floor when reaching to get a floor item.  That was just stupidity.  I don't fall.  I needed a neighbor to come help me get up.  I have been very careful since then.   I want to be able to walk with more confidence in my balance.   I walk to get the mail every day. I walk around inside the house daily.  I take the trash out each week.  I use the rail for my exterior stairs.    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 Yes. Number of falls 1  LIVING ENVIRONMENT: Lives with: lives with their spouse Lives in: House/apartment Stairs: Yes: Internal: 16 steps; on right going up, Pt lives on main level only.  External: 6 steps; on right going up Has following equipment at home: None  OCCUPATION: retired  PLOF: Independent  PATIENT GOALS: walk with better balance confidence  NEXT MD VISIT: as needed  OBJECTIVE:   DIAGNOSTIC FINDINGS:  N/A  COGNITION: Overall cognitive status: Within functional limits  for tasks assessed     SENSATION: WFL  MUSCLE LENGTH: Hamstrings: Right 50 deg; Left 50 deg Thomas test: Right positive Left positive  POSTURE: rounded shoulders, forward head, and flexed trunk   PALPATION: Non-tender throughout lumbar spine  LUMBAR ROM:   AROM eval  Flexion 30, then bends knees  Extension To neutral  Right lateral flexion 15  Left lateral flexion 15  Right rotation 50%  Left rotation 50%   (Blank rows = not tested)  LOWER EXTREMITY ROM:     WFL bil  LOWER EXTREMITY MMT:   LE 5/5 bil with exception of hip ext 4/5 bil    FUNCTIONAL TESTS:  SPPB: 6/12 (see below) Balance: 3 points (out of 4) Repeated chair 5x STS: 15.85 - 2 points Gait speed: 13.17s - 1 point   Short Physical Performance Battery:    Balance:       Side by side stance:   1 points (1)  Semi -tandem:    1 points (1)  Tandem:     1 points (2)    Gait Speed: (13.1 feet) done 2x take the best time:    1  points                                           > 8.70 sec=1 pt                                           6.21- 8.70 sec=2 pt                                           4.82-6.20 sec=3 pt                                            <  4.82 = 4 pts    Repeated Chair Stands:    2  points   (timer stopped when straight on 5th stand)                                              If used arms=0 pts                                              > 60 sec=0 pts                                              16.70 or more=1 pt                                               13.70-16.69=2 pts                                               11.20-13.69=3 pts                                               11.9 sec or less=4 pts Total score=    6/12  points <10/12 predictive of 1 or more mobility limitations and increased risk of mobility disability 6 or less/12 associated with a higher fall rate                                                                                                 SPPB:  /12  GAIT: Distance walked: 30 feet Assistive device utilized: None Level of assistance: Complete Independence Comments: short stride length, flexed trunk, lacks hip ext, lacks use of posterior chain muscles  TODAY'S TREATMENT:  DATE:  03/02/23:  Initiated HEP   PATIENT EDUCATION:  Education details: [email or web address] Person educated: Patient Education method: Programmer, multimedia, Facilities manager, and Handouts Education comprehension: verbalized understanding and returned demonstration  HOME EXERCISE PROGRAM: Access Code: [email or web address] URL: [email or web address] Date: 03/02/2023 Prepared by: Loistine Simas Shreyan Hinz  Exercises - Sit to Stand with Armchair  - 1 x daily - 7 x weekly - 2 sets - 10 reps - Standing Row with Anchored Resistance  - 1 x daily - 7 x weekly - 2 sets - 10 reps - Heel Raises with Counter Support  - 1 x daily - 7 x weekly - 2 sets - 10 reps - Standing Hip Extension with Counter Support  - 1 x daily - 7 x weekly - 2 sets - 10 reps  ASSESSMENT:  CLINICAL IMPRESSION: Patient is a 76 y.o. male who was seen today for physical therapy evaluation and treatment for gait impairment and falls.  Pt has had a single fall which he felt was situational.  He has been relatively inactive and has gotten away from previous HEP.  He has 5/5 LE strength with exception of 4/5 bil hip ext.  He ambulates with flexed trunk and lacks hip ext, demonstrating lack of activation of posterior chain muscles with gait.  He has flexibility restrictions of bil HS and lacks ability to bend forward to reach for floor without UE support.  He scores 6/12 on Short Pysical Performance Battery Test, which places him in mobility disability category and increased fall risk category.  Pt will benefit from skilled PT to improve postural strength, gait confidence, stability, balance and functional  mobility to improve safety and performance of daily activities.   OBJECTIVE IMPAIRMENTS: Abnormal gait, cardiopulmonary status limiting activity, decreased activity tolerance, decreased balance, decreased coordination, decreased endurance, decreased knowledge of condition, decreased mobility, difficulty walking, decreased ROM, decreased strength, impaired flexibility, improper body mechanics, postural dysfunction, obesity, and pain.   ACTIVITY LIMITATIONS: carrying, lifting, bending, standing, squatting, stairs, transfers, and locomotion level  PARTICIPATION LIMITATIONS: cleaning, laundry, shopping, community activity, and yard work  PERSONAL FACTORS: Age and 3+ comorbidities: a fib with cardioversion, 3 abdominal hernia surgeries with infection and long hospitalization complications post-op, falls, obesity  are also affecting patient's functional outcome.   REHAB POTENTIAL: Good  CLINICAL DECISION MAKING: Stable/uncomplicated  EVALUATION COMPLEXITY: Low   GOALS: Goals reviewed with patient? Yes  SHORT TERM GOALS: Target date: 10/24  Pt will be ind with initial HEP Baseline: Goal status: INITIAL  2.  Pt will participate in a 2' walk test for a baseline Baseline:  Goal status: INITIAL   LONG TERM GOALS: Target date: 11/21  Pt will be compliant with advanced HEP Baseline:  Goal status: INITIAL  2.  Pt will demo improved use of posterior chain muscles of trunk and LE by way of maintaining upright posture and improved hip extension/step length without cueing with a 2' walk test. Baseline:  Goal status: INITIAL  3.  Pt will improve SPPB test score to at least 7/12 to demo reduced mobility disability and reduced fall risk Baseline: 6/12 Goal status: INITIAL  4.  Pt will be able to perform posterior step to stagger stance with forward bend to pick up floor item with light UE support for bending strategy with safety. Baseline:  Goal status: INITIAL  5.  Pt will explore  strategies for floor transfer in the event of a fall for how to get back up. Baseline:  Goal status: INITIAL    PLAN:  PT  FREQUENCY: 1-2x/week  PT DURATION: 8 weeks  PLANNED INTERVENTIONS: Therapeutic exercises, Therapeutic activity, Neuromuscular re-education, Balance training, Gait training, Patient/Family education, Self Care, Joint mobilization, Aquatic Therapy, Dry Needling, Cryotherapy, Moist heat, and Manual therapy.  PLAN FOR NEXT SESSION: strengthen posterior chain (back, hip and LE), gait training for improved hip extension and upright trunk, split stance squats for floor reaching and to prep for floor transfer, balance   Michaela Shankel, PT 03/02/23 1:37 PM

## 2023-03-21 ENCOUNTER — Telehealth: Payer: Self-pay | Admitting: *Deleted

## 2023-03-21 NOTE — Telephone Encounter (Signed)
Pre-operative Risk Assessment    Patient Name: Omar Chen  DOB: 04/15/1946 MRN: [id number]    DATE OF LAST VISIT: 06/09/22 DR. Jacinto Halim DATE OF NEXT VISIT: 07/14/23 DR. [facility]  Request for Surgical Clearance    Procedure:   EXCISION OF HERNIA MESH SURGERY  Date of Surgery:  Clearance TBD                                 Surgeon:  DR. Axel Filler Surgeon's Group or Practice Name:  [facility] Phone number:  [phone] Fax number:  [phone] ATTN: Brennan Bailey, CMA   Type of Clearance Requested:   - Medical  - Pharmacy:  Hold Rivaroxaban (Xarelto)     Type of Anesthesia:  General    Additional requests/questions:    Omar Chen   03/21/2023, 5:40 PM

## 2023-03-22 NOTE — Telephone Encounter (Signed)
Name: Omar Chen  DOB: 01-23-1946  MRN: [id number]  Primary Cardiologist: None  Chart reviewed as part of pre-operative protocol coverage. Because of [name] past medical history and time since last visit, he will require a follow-up telephone visit  in order to better assess preoperative cardiovascular risk.  Pre-op covering staff: - Please schedule appointment and call patient to inform them. If patient already had an upcoming appointment within acceptable timeframe, please add "pre-op clearance" to the appointment notes so provider is aware. - Please contact requesting surgeon's office via preferred method (i.e, phone, fax) to inform them of need for appointment prior to surgery.  Per office protocol, patient can hold Xarelto for 2 days prior to procedure.    Sharlene Dory, PA-C  03/22/2023, 8:17 AM

## 2023-03-22 NOTE — Telephone Encounter (Signed)
Patient with diagnosis of afib on Xarelto for anticoagulation.    Procedure: excision of hernia mesh surgery Date of procedure: TBD  CHA2DS2-VASc Score = 4  This indicates a 4.8% annual risk of stroke. The patient's score is based upon: CHF History: 0 HTN History: 1 Diabetes History: 1 Stroke History: 0 Vascular Disease History: 0 Age Score: 2 Gender Score: 0   Remote PE/DVT in 1996.  CrCl 66mL/min using adjusted body weight Platelet count 129K  Per office protocol, patient can hold Xarelto for 2 days prior to procedure.    **This guidance is not considered finalized until pre-operative APP has relayed final recommendations.**

## 2023-03-22 NOTE — Telephone Encounter (Signed)
Spoke with patient who states that he is not sure if he is having surgery yet and will call our office back to let us know what him and the surgeon has decided.

## 2023-03-23 ENCOUNTER — Ambulatory Visit: Payer: Medicare Other | Admitting: Physical Therapy

## 2023-04-06 ENCOUNTER — Encounter: Payer: Medicare Other | Admitting: Physical Therapy

## 2023-04-10 ENCOUNTER — Telehealth: Payer: Self-pay

## 2023-04-10 ENCOUNTER — Telehealth: Payer: Self-pay | Admitting: Cardiology

## 2023-04-10 NOTE — Telephone Encounter (Signed)
  Patient Consent for Virtual Visit         Omar Chen has provided verbal consent on 04/10/2023 for a virtual visit (video or telephone).   CONSENT FOR VIRTUAL VISIT FOR:  Omar Chen  By participating in this virtual visit I agree to the following:  I hereby voluntarily request, consent and authorize [facility] and its employed or contracted physicians, physician assistants, nurse practitioners or other licensed health care professionals (the Practitioner), to provide me with telemedicine health care services (the "Services") as deemed necessary by the treating Practitioner. I acknowledge and consent to receive the Services by the Practitioner via telemedicine. I understand that the telemedicine visit will involve communicating with the Practitioner through live audiovisual communication technology and the disclosure of certain medical information by electronic transmission. I acknowledge that I have been given the opportunity to request an in-person assessment or other available alternative prior to the telemedicine visit and am voluntarily participating in the telemedicine visit.  I understand that I have the right to withhold or withdraw my consent to the use of telemedicine in the course of my care at any time, without affecting my right to future care or treatment, and that the Practitioner or I may terminate the telemedicine visit at any time. I understand that I have the right to inspect all information obtained and/or recorded in the course of the telemedicine visit and may receive copies of available information for a reasonable fee.  I understand that some of the potential risks of receiving the Services via telemedicine include:  Delay or interruption in medical evaluation due to technological equipment failure or disruption; Information transmitted may not be sufficient (e.g. poor resolution of images) to allow for appropriate medical decision making by the  Practitioner; and/or  In rare instances, security protocols could fail, causing a breach of personal health information.  Furthermore, I acknowledge that it is my responsibility to provide information about my medical history, conditions and care that is complete and accurate to the best of my ability. I acknowledge that Practitioner's advice, recommendations, and/or decision may be based on factors not within their control, such as incomplete or inaccurate data provided by me or distortions of diagnostic images or specimens that may result from electronic transmissions. I understand that the practice of medicine is not an exact science and that Practitioner makes no warranties or guarantees regarding treatment outcomes. I acknowledge that a copy of this consent can be made available to me via my patient portal [facility] MyChart), or I can request a printed copy by calling the office of [facility].    I understand that my insurance will be billed for this visit.   I have read or had this consent read to me. I understand the contents of this consent, which adequately explains the benefits and risks of the Services being provided via telemedicine.  I have been provided ample opportunity to ask questions regarding this consent and the Services and have had my questions answered to my satisfaction. I give my informed consent for the services to be provided through the use of telemedicine in my medical care

## 2023-04-10 NOTE — Telephone Encounter (Signed)
Patient is requesting to speak with the pre op team in regards to having clearance (see phone encounter 03/21/23).

## 2023-04-10 NOTE — Telephone Encounter (Signed)
Patient called and stated that he is planning to have surgery end of December/ beginning of January. Pt wanted to schedule his tele visit for 05/23/23. Med rec and consent done. Patient states that he will call us if anything changes

## 2023-04-13 ENCOUNTER — Encounter: Payer: Medicare Other | Admitting: Physical Therapy

## 2023-04-20 ENCOUNTER — Encounter: Payer: Medicare Other | Admitting: Physical Therapy

## 2023-04-25 ENCOUNTER — Telehealth: Payer: Self-pay | Admitting: Licensed Clinical Social Worker

## 2023-04-25 ENCOUNTER — Encounter: Payer: Medicare Other | Admitting: Physical Therapy

## 2023-04-25 NOTE — Patient Outreach (Signed)
  Care Coordination   Inquiry  Visit Note   04/21/2023 Name: Omar Chen MRN: [id number] DOB: [birth year]  Omar Chen is a 77 y.o. year old male who sees Emilio Aspen, MD for primary care. I  returned a miss call and spoke with patient, who reports received a missed call from [facility]. LCSW verified no referral from PCP and will send to Care Guide, to ensure  What matters to the patients health and wellness today?  Patient reports that he has an upcoming surgery next month. No concerns with SDOH or Mental Health needs at this time    SDOH assessments and interventions completed:  Yes  SDOH Interventions Today    Flowsheet Row Most Recent Value  SDOH Interventions   Food Insecurity Interventions Intervention Not Indicated  Housing Interventions Intervention Not Indicated  Transportation Interventions Intervention Not Indicated        Care Coordination Interventions:  Yes, provided   Follow up plan: No further intervention required.   Encounter Outcome:  Patient Visit Completed   Jenel Lucks, MSW, LCSW [facility] Care Management [facility]  [facility] [email or web address] Phone [phone] 9:49 AM

## 2023-04-27 ENCOUNTER — Encounter: Payer: Medicare Other | Admitting: Physical Therapy

## 2023-05-23 ENCOUNTER — Ambulatory Visit: Payer: Medicare Other | Attending: Cardiology | Admitting: Nurse Practitioner

## 2023-05-23 DIAGNOSIS — Z01810 Encounter for preprocedural cardiovascular examination: Secondary | ICD-10-CM | POA: Diagnosis not present

## 2023-05-23 NOTE — Progress Notes (Signed)
Virtual Visit via Telephone Note   Because of Omar Chen's co-morbid illnesses, he is at least at moderate risk for complications without adequate follow up.  This format is felt to be most appropriate for this patient at this time.  The patient did not have access to video technology/had technical difficulties with video requiring transitioning to audio format only (telephone).  All issues noted in this document were discussed and addressed.  No physical exam could be performed with this format.  Please refer to the patient's chart for his consent to telehealth for [facility].  Evaluation Performed:  Preoperative cardiovascular risk assessment _____________   Date:  05/23/2023   Patient ID:  Omar Chen, DOB 06-26-1945, MRN [id number] Patient Location:  Home Provider location:   Office  Primary Care Provider:  Emilio Aspen, MD Primary Cardiologist:  Yates Decamp, MD  Chief Complaint / Patient Profile   77 y.o. y/o male with a h/o paroxysmal atrial fibrillation, chronic diastolic heart failure, remote PE/DVT, hypertension, hyperlipidemia, CKD stage IIIb, type 2 diabetes, and obesity who is pending EXCISION OF HERNIA MESH SURGERY with Dr. Axel Filler of [facility] and presents today for telephonic preoperative cardiovascular risk assessment.  History of Present Illness    Omar Chen is a 77 y.o. male who presents via audio/video conferencing for a telehealth visit today.  Pt was last seen in cardiology clinic on 12/12/2022 by Dr. Jacinto Halim. At that time Omar Chen was doing well.  The patient is now pending procedure as outlined above. Since his last visit, he has done well from a cardiac standpoint.   He denies chest pain, palpitations, dyspnea, pnd, orthopnea, n, v, dizziness, syncope, edema, weight gain, or early satiety. All other systems reviewed and are otherwise negative except as noted above.   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  Allergies  No Known Allergies  Home Medications    Prior to Admission medications   Medication Sig Start Date End Date Taking? Authorizing Provider  amLODipine-benazepril (LOTREL) 10-20 MG capsule Take 1 capsule by mouth daily. 03/20/22   [provider]  atorvastatin (LIPITOR) 40 MG tablet Take 40 mg by mouth daily.    [provider]  FARXIGA 10 MG TABS tablet TAKE 1 TABLET DAILY 10/17/22   Yates Decamp, MD  fluticasone [facility]) 50 MCG/ACT nasal spray Place 1-2 sprays into the nose as needed for allergies. 09/09/14   [provider]  furosemide (LASIX) 20 MG tablet Take 20 mg by mouth.    [provider]  hydrALAZINE (APRESOLINE) 50 MG tablet Take 1 tablet (50 mg total) by mouth 2 (two) times daily. 08/29/22   Yates Decamp, MD  isosorbide dinitrate (ISORDIL) 30 MG tablet TAKE 1 TABLET BY MOUTH THREE TIMES A DAY 12/29/22   Yates Decamp, MD  metoprolol succinate (TOPROL-XL) 50 MG 24 hr tablet Take 25 tablets by mouth daily. 07/26/21   [provider]  olopatadine (PATANOL) 0.1 % ophthalmic solution Apply 1-2 drops to eye. 03/26/20   [provider]  Rivaroxaban (XARELTO) 15 MG TABS tablet Take 1 tablet (15 mg total)  by mouth daily with supper. 12/22/22   Yates Decamp, MD    Physical Exam    Vital Signs:  Omar Chen does not have vital signs available for review today.  Given telephonic nature of communication, physical exam is limited. AAOx3. NAD. Normal affect.  Speech and respirations are unlabored.  Accessory Clinical Findings    None  Assessment & Plan    1.  Preoperative Cardiovascular Risk  Assessment:  According to the Revised Cardiac Risk Index (RCRI), his Perioperative Risk of Major Cardiac Event is (%): 0.9. His Functional Capacity in METs is: 5.07 according to the [facility] Activity Status Index (DASI). Therefore, based on ACC/AHA guidelines, patient would be at acceptable risk for the planned procedure without further cardiovascular testing.   The patient was advised that if he develops new symptoms prior to surgery to contact our office to arrange for a follow-up visit, and he verbalized understanding.  Per office protocol, patient can hold Xarelto for 2 days prior to procedure.  Please resume Xarelto as soon as possible postprocedure, at the discretion of the surgeon.   A copy of this note will be routed to requesting surgeon.  Time:   Today, I have spent 10 minutes with the patient with telehealth technology discussing medical history, symptoms, and management plan.     Joylene Grapes, NP  05/23/2023, 9:16 AM

## 2023-06-14 ENCOUNTER — Ambulatory Visit: Payer: Self-pay | Admitting: Cardiology

## 2023-07-14 ENCOUNTER — Ambulatory Visit: Payer: Medicare Other | Admitting: Cardiology

## 2023-08-08 ENCOUNTER — Other Ambulatory Visit: Payer: Self-pay | Admitting: Cardiology

## 2023-08-08 DIAGNOSIS — I10 Essential (primary) hypertension: Secondary | ICD-10-CM

## 2023-09-14 ENCOUNTER — Encounter: Payer: Self-pay | Admitting: Cardiology

## 2023-09-14 ENCOUNTER — Ambulatory Visit: Payer: Medicare Other | Attending: Cardiology | Admitting: Cardiology

## 2023-09-14 VITALS — BP 140/72 | HR 87 | Resp 16 | Ht 73.0 in | Wt 264.4 lb

## 2023-09-14 DIAGNOSIS — I10 Essential (primary) hypertension: Secondary | ICD-10-CM | POA: Diagnosis present

## 2023-09-14 DIAGNOSIS — E782 Mixed hyperlipidemia: Secondary | ICD-10-CM | POA: Diagnosis not present

## 2023-09-14 DIAGNOSIS — I5032 Chronic diastolic (congestive) heart failure: Secondary | ICD-10-CM | POA: Insufficient documentation

## 2023-09-14 DIAGNOSIS — E119 Type 2 diabetes mellitus without complications: Secondary | ICD-10-CM | POA: Insufficient documentation

## 2023-09-14 DIAGNOSIS — I480 Paroxysmal atrial fibrillation: Secondary | ICD-10-CM | POA: Insufficient documentation

## 2023-09-14 MED ORDER — FARXIGA 10 MG PO TABS
10.0000 mg | ORAL_TABLET | Freq: Every day | ORAL | 3 refills | Status: AC
Start: 2023-09-14 — End: ?

## 2023-09-14 MED ORDER — ISOSORBIDE DINITRATE 30 MG PO TABS
30.0000 mg | ORAL_TABLET | Freq: Three times a day (TID) | ORAL | 3 refills | Status: DC
Start: 2023-09-14 — End: 2023-10-02

## 2023-09-14 MED ORDER — RIVAROXABAN 15 MG PO TABS: 15.0000 mg | ORAL_TABLET | Freq: Every day | ORAL | 3 refills | Status: AC

## 2023-09-14 NOTE — Patient Instructions (Signed)
 Medication Instructions:  Refilled medications *If you need a refill on your cardiac medications before your next appointment, please call your pharmacy*  Lab Work: NONE If you have labs (blood work) drawn today and your tests are completely normal, you will receive your results only by: MyChart Message (if you have MyChart) OR A paper copy in the mail If you have any lab test that is abnormal or we need to change your treatment, we will call you to review the results.  Testing/Procedures: NONE  Follow-Up: At [facility], you and your health needs are our priority.  As part of our continuing mission to provide you with exceptional heart care, our providers are all part of one team.  This team includes your primary Cardiologist (physician) and Advanced Practice Providers or APPs (Physician Assistants and Nurse Practitioners) who all work together to provide you with the care you need, when you need it.  Your next appointment:   1 year(s)  Provider:   Yates Decamp, MD     We recommend signing up for the patient portal called "MyChart".  Sign up information is provided on this After Visit Summary.  MyChart is used to connect with patients for Virtual Visits (Telemedicine).  Patients are able to view lab/test results, encounter notes, upcoming appointments, etc.  Non-urgent messages can be sent to your provider as well.   To learn more about what you can do with MyChart, go to [email or web address].   Other Instructions Contact your insurance company regarding payment plans for medications      1st Floor: - Lobby - Registration  - Pharmacy  - Lab - Cafe  2nd Floor: - PV Lab - Diagnostic Testing (echo, CT, nuclear med)  3rd Floor: - Vacant  4th Floor: - TCTS (cardiothoracic surgery) - AFib Clinic - Structural Heart Clinic - Vascular Surgery  - Vascular Ultrasound  5th Floor: - HeartCare Cardiology (general and EP) - Clinical Pharmacy for coumadin,  hypertension, lipid, weight-loss medications, and med management appointments    Valet parking services will be available as well.

## 2023-09-14 NOTE — Progress Notes (Signed)
 Cardiology Office Note:  .   Date:  09/16/2023  ID:  Omar Chen, DOB 1945-06-29, MRN [id number] PCP: Benedetta Bradley, MD  [facility] Providers Cardiologist:  Knox Perl, MD   History of Present Illness: .   Omar Chen is a 78 y.o. 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  However due to high CHA2DS2-VASc score, he is continued on anticoagulation.  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   This is a 35-month follow-up.  Dyspnea has remained stable.  No PND or orthopnea.  Denies chest pain or palpitations.  Overall feeling well, has not used any diuretics in the last 6 months.  He had been very careful with his diet and had lost significant amount of weight but over the last 3 to 4 months has gained weight.  He has no specific complaints.  Wife is present.  Discussed the use of AI scribe software for clinical note transcription with the patient, who gave verbal consent to proceed.  Labs   Lab Results  Component Value Date   CHOL 157 08/16/2021   HDL 52 08/16/2021   LDLCALC 87 08/16/2021   TRIG 99 08/16/2021   Lab Results  Component Value Date   NA 139 02/21/2022   K 3.7 02/21/2022   CO2 28 02/21/2022   GLUCOSE 107 (H) 02/21/2022   BUN 24 02/21/2022   CREATININE 1.66 (H) 02/21/2022   CALCIUM 9.0 02/21/2022   EGFR 43 (L) 02/21/2022      Latest Ref Rng & Units 02/21/2022    2:39 PM  BMP  Glucose 65 - 99 mg/dL 409   BUN 7 - 25 mg/dL 24   Creatinine 8.11 - 1.28 mg/dL 9.14   BUN/Creat Ratio 6 - 22 (calc) 14   Sodium 135 - 146 mmol/L 139   Potassium 3.5 - 5.3 mmol/L 3.7   Chloride 98 - 110 mmol/L 102   CO2 20 - 32 mmol/L 28   Calcium 8.6 - 10.3 mg/dL 9.0       Latest  Ref Rng & Units 02/21/2022    2:39 PM 08/16/2021   10:32 AM  CBC  WBC 3.8 - 10.8 Thousand/uL 5.8  8.5   Hemoglobin 13.2 - 17.1 g/dL 78.2  95.6   Hematocrit 38.5 - 50.0 % 39.3  36.4   Platelets 140 - 400 Thousand/uL 126  183    Lab Results  Component Value Date   HGBA1C 5.9 (H) 08/16/2021    Lab Results  Component Value Date   TSH 3.580 08/16/2021    External Labs:  Care Everywhere PCP labs 05/11/2023:  Hb 14.2/HCT 42.1, platelets 164, normal indicis.  A1c 6.0%.  Labs 04/13/2023:  Serum glucose 160 mg, BUN 22, creatinine 1.53, EGFR 47 mL, potassium 3.8 LFTs normal.  Review of Systems  Cardiovascular:  Positive for dyspnea on exertion (stable) and leg swelling. Negative for chest pain.   Physical Exam:   VS:  BP (!) 140/72 (BP Location: Left Arm, Patient Position: Sitting, Cuff Size: Large)   Pulse 87   Resp 16   Ht 6\' 1"  (1.854 m)   Wt 264 lb 6.4 oz (119.9 kg)   SpO2 98%   BMI 34.88 kg/m    Wt Readings from Last 3 Encounters:  09/14/23 264 lb  6.4 oz (119.9 kg)  12/12/22 252 lb 6.4 oz (114.5 kg)  06/09/22 243 lb 6.4 oz (110.4 kg)    Physical Exam Constitutional:      Appearance: He is morbidly obese.  Neck:     Vascular: No JVD.  Cardiovascular:     Rate and Rhythm: Normal rate and regular rhythm.     Pulses: Intact distal pulses.     Heart sounds: S1 normal and S2 normal. Murmur heard.     Early systolic murmur is present with a grade of 2/6 at the upper right sternal border.     No gallop.  Pulmonary:     Effort: Pulmonary effort is normal.     Breath sounds: Normal breath sounds.  Abdominal:     General: Bowel sounds are normal.     Palpations: Abdomen is soft.  Musculoskeletal:     Right lower leg: Edema (Chronic dermatitis changes from venous stasis noted, 2+ pitting edema below knee.  Mild excoriation and redness noted, no obvious signs of cellulitis.) present.     Left lower leg: Edema (2+ pitting edema.) present.    Studies Reviewed: Aaron Aas    TEE  guided direct-current cardioversion 03/29/2021:  Normal TEE with normal RV systolic function.  No thrombus in the left atrial appendage.  Successful direct-current cardioversion with 300 J x 1 from atrial fibrillation to normal sinus rhythm.   PCV MYOCARDIAL PERFUSION WITH LEXISCAN 09/22/2021  Lexiscan/modified Bruce nuclear stress test performed using 1-day protocol. SPECT images show decreased tracer uptake in inferior myocardium, likely related to diaphragmatic attenuation. No definitive evidence of ischemia seen. Stress LVEF 77%. Low risk study.  EKG:    EKG 12/12/2022: Sinus rhythm with first-degree block at rate of 66 bpm, left atrial enlargement otherwise normal EKG. Compared to 06/09/2022, no significant change.   Medications and allergies    No Known Allergies   Current Outpatient Medications:    amLODipine-benazepril (LOTREL) 10-20 MG capsule, Take 1 capsule by mouth daily., Disp: , Rfl:    atorvastatin (LIPITOR) 40 MG tablet, Take 40 mg by mouth daily., Disp: , Rfl:    fluticasone (FLONASE) 50 MCG/ACT nasal spray, Place 1-2 sprays into the nose as needed for allergies., Disp: , Rfl:    furosemide (LASIX) 20 MG tablet, Take 20 mg by mouth daily as needed for edema., Disp: , Rfl:    hydrALAZINE (APRESOLINE) 50 MG tablet, TAKE 1 TABLET TWICE A DAY, Disp: 180 tablet, Rfl: 1   metoprolol succinate (TOPROL-XL) 50 MG 24 hr tablet, Take 25 tablets by mouth daily., Disp: , Rfl:    olopatadine (PATANOL) 0.1 % ophthalmic solution, Apply 1-2 drops to eye., Disp: , Rfl:    FARXIGA 10 MG TABS tablet, Take 1 tablet (10 mg total) by mouth daily., Disp: 90 tablet, Rfl: 3   isosorbide dinitrate (ISORDIL) 30 MG tablet, Take 1 tablet (30 mg total) by mouth 3 (three) times daily., Disp: 270 tablet, Rfl: 3   Rivaroxaban (XARELTO) 15 MG TABS tablet, Take 1 tablet (15 mg total) by mouth daily with supper., Disp: 90 tablet, Rfl: 3   Meds ordered this encounter  Medications   Rivaroxaban (XARELTO) 15 MG  TABS tablet    Sig: Take 1 tablet (15 mg total) by mouth daily with supper.    Dispense:  90 tablet    Refill:  3   FARXIGA 10 MG TABS tablet    Sig: Take 1 tablet (10 mg total) by mouth daily.    Dispense:  90 tablet    Refill:  3   isosorbide dinitrate (ISORDIL) 30 MG tablet    Sig: Take 1 tablet (30 mg total) by mouth 3 (three) times daily.    Dispense:  270 tablet    Refill:  3     Medications Discontinued During This Encounter  Medication Reason   Rivaroxaban (XARELTO) 15 MG TABS tablet Reorder   FARXIGA 10 MG TABS tablet Reorder   isosorbide dinitrate (ISORDIL) 30 MG tablet Reorder     ASSESSMENT AND PLAN: .      ICD-10-CM   1. Paroxysmal atrial fibrillation (HCC)  I48.0 Rivaroxaban (XARELTO) 15 MG TABS tablet    2. Primary hypertension  I10 isosorbide dinitrate (ISORDIL) 30 MG tablet    3. Mixed hyperlipidemia  E78.2     4. Chronic diastolic (congestive) heart failure (HCC)  I50.32 FARXIGA 10 MG TABS tablet    5. Type 2 diabetes mellitus without complication, without long-term current use of insulin (HCC)  E11.9       1. Paroxysmal atrial fibrillation (HCC) Atrial fibrillation increases stroke risk, requiring anticoagulation with Xarelto to prevent thromboembolic events. Continue Xarelto and provide information on Medicare program for medication cost assistance. Send prescription for Xarelto to [facility]. His history of pulmonary embolism and deep vein thrombosis from 1996 increases the risk of recurrence, especially with reduced mobility. Anticoagulation with Xarelto is necessary. Continue Xarelto, provide information on Medicare program for medication cost assistance, and send prescription to [facility].  2. Primary hypertension Blood pressure is well-controlled, typically between 120-140/70-80 mmHg, with an isolated low reading of 117/51 mmHg noted without symptoms. Continue the current antihypertensive regimen of four medications.  With weight loss I  suspect his blood pressure will improve further.  With regard to chronic diastolic heart failure, symptoms of dyspnea and leg edema has remained stable, recently with weight gain leg edema slightly worsened but again patient continues to remain active and overall much improved since I first met him.  3. Mixed hyperlipidemia Persistent hypercholesterolemia with total cholesterol at 268 mg/dL, LDL at 244 mg/dL, and non-HDL cholesterol at 190 mg/dL. Previously discontinued atorvastatin due to muscle cramps but currently on atorvastatin 40 mg and Zetia. LDL has improved to 87 and lipid panel pending with Dr. Teofilo Fellers. Check Vit D if not done as it may induce myalgia along with TSH.   Type 2 Diabetes Mellitus   Type 2 diabetes with recent A1c of 6.0%.  Presently on Farxiga.  Emphasized weight management, dietary changes, exercise, and potential use of GLP-1 receptor agonists like Mounjaro or Ozempic for weight loss and diabetes management. Discussed side effects of GLP-1 receptor agonists, including nausea, vomiting, and constipation, and the importance of dietary adjustments to mitigate these effects.  Medication Management   Refills are required for Farxiga, isosorbide mononitrate, and Xarelto. Ensure an adequate supply of metoprolol, hydralazine, atorvastatin, and amlodipine. Provide information on Medicare program capping annual medication costs for financial relief.  Follow-up   Schedule a follow-up appointment in one year unless issues arise sooner. Assessment and Plan  Signed,  Knox Perl, MD, [facility] 09/16/2023, 8:39 AM [facility] [address] [place], [place] [address] Phone: [phone]. Fax:  [phone]

## 2023-09-16 ENCOUNTER — Encounter: Payer: Self-pay | Admitting: Cardiology

## 2023-09-21 ENCOUNTER — Telehealth: Payer: Self-pay | Admitting: Cardiology

## 2023-09-21 DIAGNOSIS — I10 Essential (primary) hypertension: Secondary | ICD-10-CM

## 2023-09-21 NOTE — Telephone Encounter (Signed)
 Pt c/o medication issue:  1. Name of Medication:   isosorbide dinitrate (ISORDIL) 30 MG tablet   2. How are you currently taking this medication (dosage and times per day)?   3. Are you having a reaction (difficulty breathing--STAT)?   4. What is your medication issue?   Patient wants a call back from Dr. Maida Sciara nurse directly for guidance regarding dosage for this medication.

## 2023-09-21 NOTE — Telephone Encounter (Signed)
 Spoke with patient and he states that at last visit 4/10 is[facility]orbide was [facility] for him to take 30 mg TID.  He states for the last 3 years he has been taking isosorbide 30 mg BID and it has been working. He was not aware that it was going to be increased. He would like to continue taking 30 mg BID.   AVS does not state any changes mediocations

## 2023-09-21 NOTE — Telephone Encounter (Signed)
 I increased it for BP control, his BP I would like it to be < 130/80 if possible

## 2023-09-21 NOTE — Telephone Encounter (Signed)
 Left voicemail to return call to office

## 2023-09-27 NOTE — Telephone Encounter (Signed)
 Sure take twice daily

## 2023-09-27 NOTE — Telephone Encounter (Signed)
 I spoke with patient and gave him message from Dr Berry Bristol.  Patient reports he checks BP every day and readings are sent to Dr Henderson's office.  Patient has been taking isordil  twice daily.  Has one reading of 138/72 but that other readings were low--113/54, 117/51.  Patient feels BP will be too low if he takes isordil  TID and that his BP was high when he saw Dr Berry Bristol due to walking in from parking lot and rushing. I let patient know I would send BP readings to Dr Berry Bristol

## 2023-10-02 MED ORDER — ISOSORBIDE DINITRATE 30 MG PO TABS: 30.0000 mg | ORAL_TABLET | Freq: Two times a day (BID) | ORAL | 3 refills | Status: AC

## 2023-10-02 MED ORDER — ISOSORBIDE DINITRATE 30 MG PO TABS: 30.0000 mg | ORAL_TABLET | Freq: Two times a day (BID) | ORAL | 3 refills | Status: DC

## 2023-10-02 NOTE — Telephone Encounter (Signed)
 Patient notified.  Updated prescription sent to [facility]

## 2023-10-02 NOTE — Addendum Note (Signed)
 Addended by: Dimitrius Steedman L on: 10/02/2023 10:22 AM   Modules accepted: Orders

## 2024-01-22 IMAGING — CR DG CHEST 2V
2 series · 2 of 2 positions shown · non-contrast
Comparison: None.

CLINICAL DATA: Chronic diastolic CHF

EXAM:
CHEST - 2 VIEW

[w chest pa]
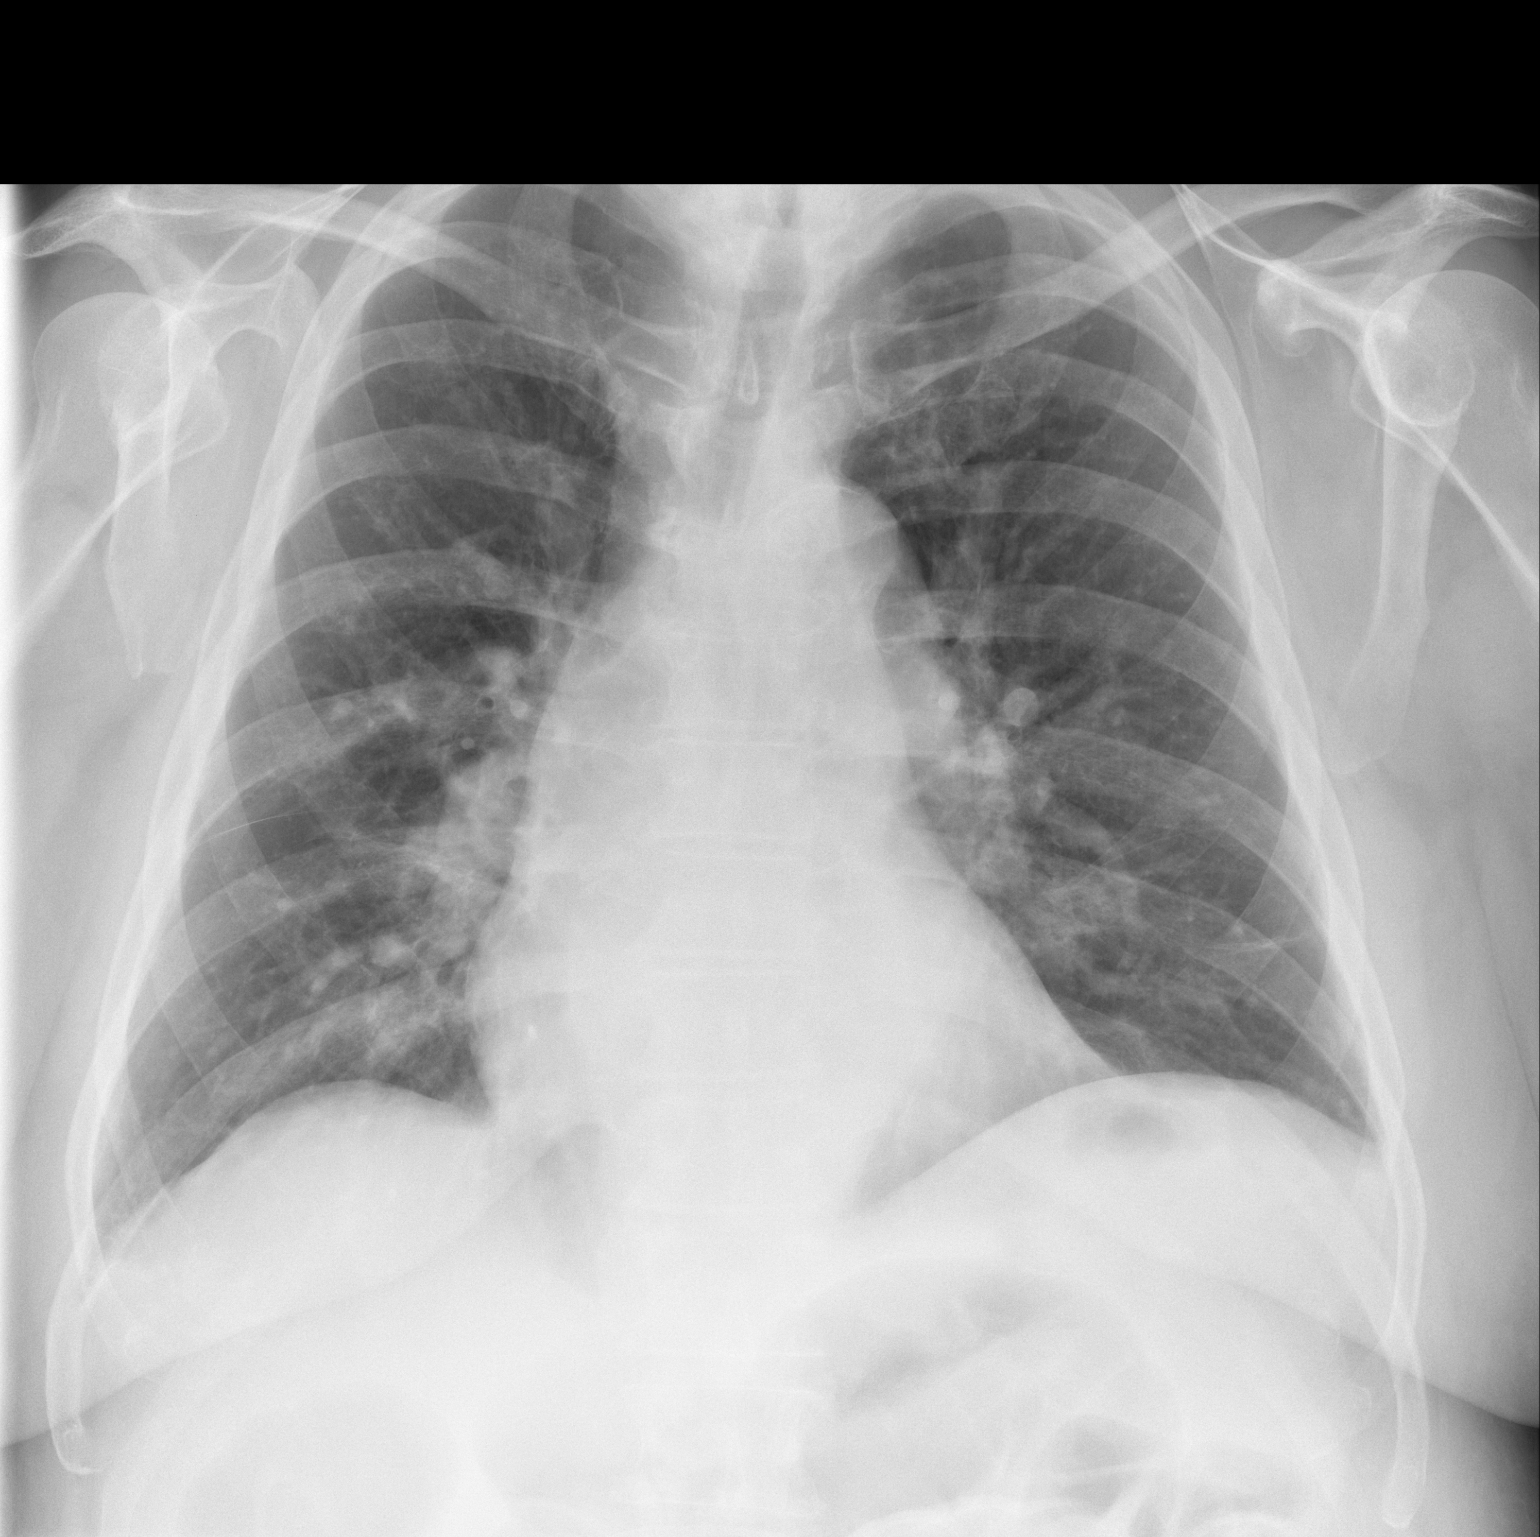

[w chest lat]
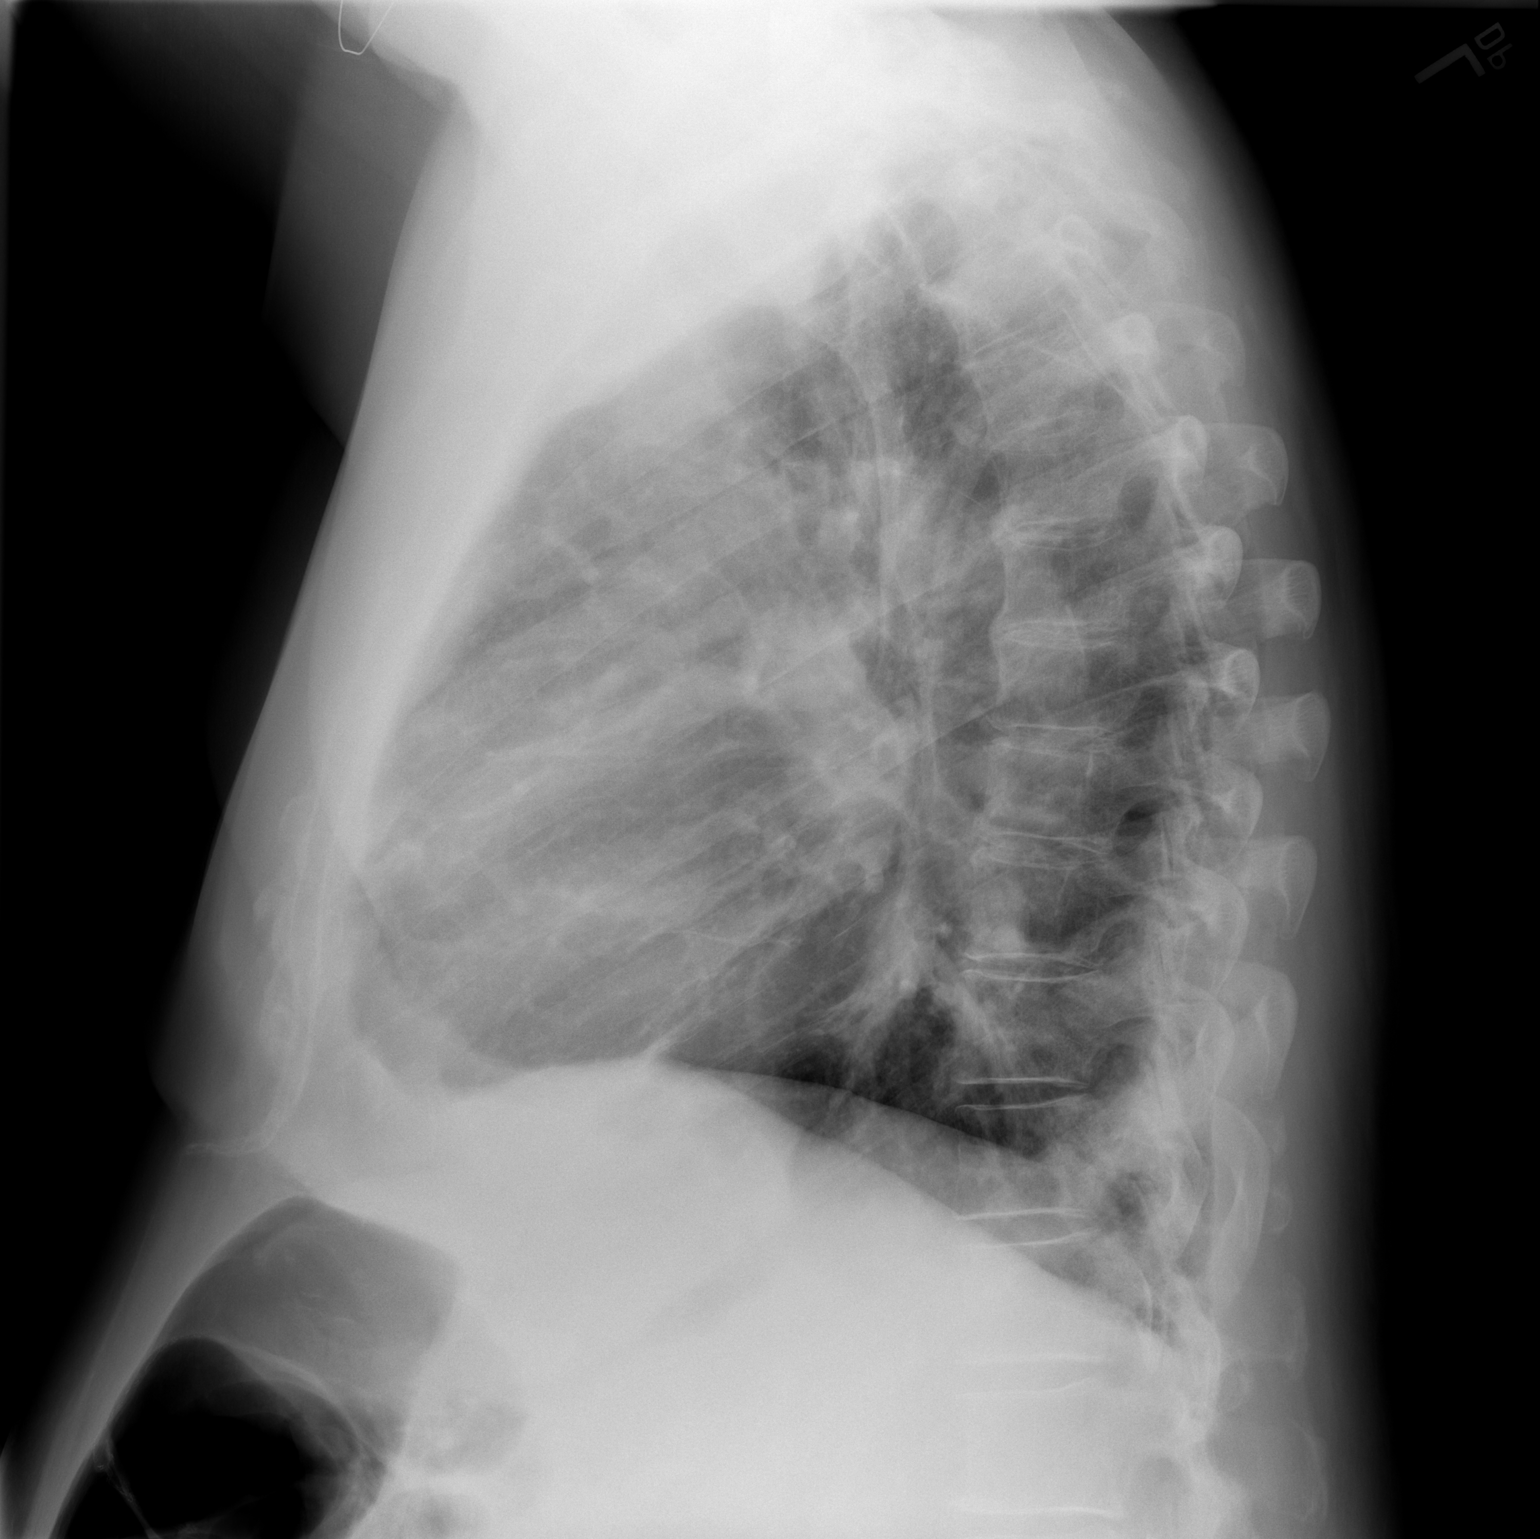

[2 of 2 positions shown; findings below may reference images not displayed]

FINDINGS: The heart and mediastinal contours are within normal limits. Aortic
calcification. Slightly prominent hilar vasculature.

No focal consolidation. No pulmonary edema. No pleural effusion. No
pneumothorax.

No acute osseous abnormality.
IMPRESSION: Pulmonary venous congestion with no frank pulmonary edema.

## 2024-01-23 ENCOUNTER — Other Ambulatory Visit: Payer: Self-pay | Admitting: Cardiology

## 2024-01-23 DIAGNOSIS — I10 Essential (primary) hypertension: Secondary | ICD-10-CM

## 2024-03-20 ENCOUNTER — Encounter (HOSPITAL_BASED_OUTPATIENT_CLINIC_OR_DEPARTMENT_OTHER): Attending: General Surgery | Admitting: General Surgery

## 2024-03-20 DIAGNOSIS — I890 Lymphedema, not elsewhere classified: Secondary | ICD-10-CM | POA: Insufficient documentation

## 2024-03-20 DIAGNOSIS — E11622 Type 2 diabetes mellitus with other skin ulcer: Secondary | ICD-10-CM | POA: Insufficient documentation

## 2024-03-20 DIAGNOSIS — I872 Venous insufficiency (chronic) (peripheral): Secondary | ICD-10-CM | POA: Diagnosis not present

## 2024-03-20 DIAGNOSIS — I5032 Chronic diastolic (congestive) heart failure: Secondary | ICD-10-CM | POA: Insufficient documentation

## 2024-03-20 DIAGNOSIS — L97812 Non-pressure chronic ulcer of other part of right lower leg with fat layer exposed: Secondary | ICD-10-CM | POA: Diagnosis not present

## 2024-03-25 ENCOUNTER — Encounter (HOSPITAL_BASED_OUTPATIENT_CLINIC_OR_DEPARTMENT_OTHER): Admitting: General Surgery

## 2024-03-25 DIAGNOSIS — E11622 Type 2 diabetes mellitus with other skin ulcer: Secondary | ICD-10-CM | POA: Diagnosis not present

## 2024-06-07 ENCOUNTER — Observation Stay (HOSPITAL_COMMUNITY)

## 2024-06-07 ENCOUNTER — Encounter (HOSPITAL_COMMUNITY): Payer: Self-pay | Admitting: Internal Medicine

## 2024-06-07 ENCOUNTER — Other Ambulatory Visit: Payer: Self-pay

## 2024-06-07 ENCOUNTER — Inpatient Hospital Stay (HOSPITAL_COMMUNITY)
Admission: EM | Admit: 2024-06-07 | Discharge: 2024-06-09 | DRG: 872 | Disposition: A | Discharge status: Home Health | Attending: Internal Medicine | Admitting: Internal Medicine

## 2024-06-07 ENCOUNTER — Emergency Department (HOSPITAL_COMMUNITY)

## 2024-06-07 DIAGNOSIS — I10 Essential (primary) hypertension: Secondary | ICD-10-CM | POA: Diagnosis present

## 2024-06-07 DIAGNOSIS — N1832 Chronic kidney disease, stage 3b: Secondary | ICD-10-CM | POA: Diagnosis present

## 2024-06-07 DIAGNOSIS — N400 Enlarged prostate without lower urinary tract symptoms: Secondary | ICD-10-CM | POA: Diagnosis present

## 2024-06-07 DIAGNOSIS — E6609 Other obesity due to excess calories: Secondary | ICD-10-CM

## 2024-06-07 DIAGNOSIS — R21 Rash and other nonspecific skin eruption: Secondary | ICD-10-CM | POA: Diagnosis present

## 2024-06-07 DIAGNOSIS — E66812 Obesity, class 2: Secondary | ICD-10-CM | POA: Diagnosis present

## 2024-06-07 DIAGNOSIS — Z79899 Other long term (current) drug therapy: Secondary | ICD-10-CM

## 2024-06-07 DIAGNOSIS — L03115 Cellulitis of right lower limb: Secondary | ICD-10-CM | POA: Diagnosis present

## 2024-06-07 DIAGNOSIS — I129 Hypertensive chronic kidney disease with stage 1 through stage 4 chronic kidney disease, or unspecified chronic kidney disease: Secondary | ICD-10-CM | POA: Diagnosis present

## 2024-06-07 DIAGNOSIS — A419 Sepsis, unspecified organism: Secondary | ICD-10-CM | POA: Diagnosis not present

## 2024-06-07 DIAGNOSIS — E1169 Type 2 diabetes mellitus with other specified complication: Secondary | ICD-10-CM | POA: Diagnosis present

## 2024-06-07 DIAGNOSIS — M869 Osteomyelitis, unspecified: Secondary | ICD-10-CM | POA: Diagnosis present

## 2024-06-07 DIAGNOSIS — Z1152 Encounter for screening for COVID-19: Secondary | ICD-10-CM

## 2024-06-07 DIAGNOSIS — Z8261 Family history of arthritis: Secondary | ICD-10-CM

## 2024-06-07 DIAGNOSIS — Z6838 Body mass index (BMI) 38.0-38.9, adult: Secondary | ICD-10-CM

## 2024-06-07 DIAGNOSIS — Z800 Family history of malignant neoplasm of digestive organs: Secondary | ICD-10-CM

## 2024-06-07 DIAGNOSIS — L0390 Cellulitis, unspecified: Secondary | ICD-10-CM | POA: Diagnosis not present

## 2024-06-07 DIAGNOSIS — E782 Mixed hyperlipidemia: Secondary | ICD-10-CM | POA: Diagnosis present

## 2024-06-07 DIAGNOSIS — R638 Other symptoms and signs concerning food and fluid intake: Secondary | ICD-10-CM | POA: Insufficient documentation

## 2024-06-07 DIAGNOSIS — Z7901 Long term (current) use of anticoagulants: Secondary | ICD-10-CM

## 2024-06-07 DIAGNOSIS — E872 Acidosis, unspecified: Secondary | ICD-10-CM | POA: Diagnosis present

## 2024-06-07 DIAGNOSIS — M549 Dorsalgia, unspecified: Secondary | ICD-10-CM | POA: Insufficient documentation

## 2024-06-07 DIAGNOSIS — E1122 Type 2 diabetes mellitus with diabetic chronic kidney disease: Secondary | ICD-10-CM | POA: Diagnosis present

## 2024-06-07 DIAGNOSIS — G4733 Obstructive sleep apnea (adult) (pediatric): Secondary | ICD-10-CM | POA: Diagnosis present

## 2024-06-07 DIAGNOSIS — I480 Paroxysmal atrial fibrillation: Secondary | ICD-10-CM | POA: Diagnosis present

## 2024-06-07 LAB — URINALYSIS, W/ REFLEX TO CULTURE (INFECTION SUSPECTED)
Bilirubin Urine: NEGATIVE
Glucose, UA: >=500 mg/dL — AB
Ketones, ur: NEGATIVE mg/dL
Leukocytes,Ua: NEGATIVE
Nitrite: NEGATIVE
Protein, ur: NEGATIVE mg/dL
Specific Gravity, Urine: 1.014 (ref 1.005–1.030)
pH: 5 (ref 5.0–8.0)

## 2024-06-07 LAB — COMPREHENSIVE METABOLIC PANEL WITH GFR
ALT: 11 U/L (ref 0–44)
AST: 15 U/L (ref 15–41)
Albumin: 3.8 g/dL (ref 3.5–5.0)
Alkaline Phosphatase: 53 U/L (ref 38–126)
Anion gap: 11 (ref 5–15)
BUN: 23 mg/dL (ref 8–23)
CO2: 26 mmol/L (ref 22–32)
Calcium: 8.8 mg/dL — ABNORMAL LOW (ref 8.9–10.3)
Chloride: 103 mmol/L (ref 98–111)
Creatinine, Ser: 1.59 mg/dL — ABNORMAL HIGH (ref 0.61–1.24)
GFR, Estimated: 44 mL/min — ABNORMAL LOW
Glucose, Bld: 161 mg/dL — ABNORMAL HIGH (ref 70–99)
Potassium: 3.7 mmol/L (ref 3.5–5.1)
Sodium: 140 mmol/L (ref 135–145)
Total Bilirubin: 1.3 mg/dL — ABNORMAL HIGH (ref 0.0–1.2)
Total Protein: 6.4 g/dL — ABNORMAL LOW (ref 6.5–8.1)

## 2024-06-07 LAB — CBC WITH DIFFERENTIAL/PLATELET
Abs Immature Granulocytes: 0.16 K/uL — ABNORMAL HIGH (ref 0.00–0.07)
Basophils Absolute: 0 K/uL (ref 0.0–0.1)
Basophils Relative: 0 %
Eosinophils Absolute: 0.1 K/uL (ref 0.0–0.5)
Eosinophils Relative: 0 %
HCT: 42.9 % (ref 39.0–52.0)
Hemoglobin: 14.2 g/dL (ref 13.0–17.0)
Immature Granulocytes: 1 %
Lymphocytes Relative: 2 %
Lymphs Abs: 0.3 K/uL — ABNORMAL LOW (ref 0.7–4.0)
MCH: 32.3 pg (ref 26.0–34.0)
MCHC: 33.1 g/dL (ref 30.0–36.0)
MCV: 97.5 fL (ref 80.0–100.0)
Monocytes Absolute: 1.2 K/uL — ABNORMAL HIGH (ref 0.1–1.0)
Monocytes Relative: 8 %
Neutro Abs: 13.8 K/uL — ABNORMAL HIGH (ref 1.7–7.7)
Neutrophils Relative %: 89 %
Platelets: 116 K/uL — ABNORMAL LOW (ref 150–400)
RBC: 4.4 MIL/uL (ref 4.22–5.81)
RDW: 13.7 % (ref 11.5–15.5)
WBC: 15.6 K/uL — ABNORMAL HIGH (ref 4.0–10.5)
nRBC: 0 % (ref 0.0–0.2)

## 2024-06-07 LAB — C-REACTIVE PROTEIN: CRP: 9.7 mg/dL — ABNORMAL HIGH

## 2024-06-07 LAB — RESP PANEL BY RT-PCR (RSV, FLU A&B, COVID)  RVPGX2
Influenza A by PCR: NEGATIVE
Influenza B by PCR: NEGATIVE
Resp Syncytial Virus by PCR: NEGATIVE
SARS Coronavirus 2 by RT PCR: NEGATIVE

## 2024-06-07 LAB — GLUCOSE, CAPILLARY
Glucose-Capillary: 122 mg/dL — ABNORMAL HIGH (ref 70–99)
Glucose-Capillary: 127 mg/dL — ABNORMAL HIGH (ref 70–99)

## 2024-06-07 LAB — I-STAT CG4 LACTIC ACID, ED
Lactic Acid, Venous: 2.1 mmol/L (ref 0.5–1.9)
Lactic Acid, Venous: 0.9 mmol/L (ref 0.5–1.9)

## 2024-06-07 LAB — PROTIME-INR
INR: 2 — ABNORMAL HIGH (ref 0.8–1.2)
Prothrombin Time: 23.9 s — ABNORMAL HIGH (ref 11.4–15.2)

## 2024-06-07 LAB — HEMOGLOBIN A1C
Hgb A1c MFr Bld: 5.7 % — ABNORMAL HIGH (ref 4.8–5.6)
Mean Plasma Glucose: 116.89 mg/dL

## 2024-06-07 LAB — SEDIMENTATION RATE: Sed Rate: 6 mm/h (ref 0–16)

## 2024-06-07 LAB — PREALBUMIN: Prealbumin: 16 mg/dL — ABNORMAL LOW (ref 18–38)

## 2024-06-07 MED ORDER — RIVAROXABAN 15 MG PO TABS
15.0000 mg | ORAL_TABLET | Freq: Every day | ORAL | Status: DC
  Administered 2024-06-07 – 2024-06-08 (×2): 15 mg via ORAL
  Filled 2024-06-07 (×2): qty 1

## 2024-06-07 MED ORDER — ACETAMINOPHEN 325 MG PO TABS: 650.0000 mg | ORAL_TABLET | Freq: Four times a day (QID) | ORAL | Status: DC | PRN

## 2024-06-07 MED ORDER — INSULIN ASPART 100 UNIT/ML IJ SOLN
0.0000 [IU] | Freq: Three times a day (TID) | INTRAMUSCULAR | Status: DC
  Administered 2024-06-08: 2 [IU] via SUBCUTANEOUS
  Filled 2024-06-07 (×2): qty 2

## 2024-06-07 MED ORDER — METHOCARBAMOL 500 MG PO TABS
500.0000 mg | ORAL_TABLET | Freq: Once | ORAL | Status: AC
  Administered 2024-06-07: 500 mg via ORAL
  Filled 2024-06-07: qty 1

## 2024-06-07 MED ORDER — LIDOCAINE 5 % EX PTCH
1.0000 | MEDICATED_PATCH | Freq: Once | CUTANEOUS | Status: AC
  Administered 2024-06-07: 1 via TRANSDERMAL
  Filled 2024-06-07: qty 1

## 2024-06-07 MED ORDER — LACTATED RINGERS IV SOLN: INTRAVENOUS | Status: DC

## 2024-06-07 MED ORDER — MORPHINE SULFATE (PF) 2 MG/ML IV SOLN: 2.0000 mg | INTRAVENOUS | Status: DC | PRN

## 2024-06-07 MED ORDER — OLOPATADINE HCL 0.1 % OP SOLN
1.0000 [drp] | Freq: Two times a day (BID) | OPHTHALMIC | Status: DC
  Administered 2024-06-07 – 2024-06-08 (×2): 1 [drp] via OPHTHALMIC
  Administered 2024-06-09: 2 [drp] via OPHTHALMIC
  Filled 2024-06-07: qty 5

## 2024-06-07 MED ORDER — HYDRALAZINE HCL 20 MG/ML IJ SOLN: 5.0000 mg | INTRAMUSCULAR | Status: DC | PRN

## 2024-06-07 MED ORDER — BISACODYL 5 MG PO TBEC: 5.0000 mg | DELAYED_RELEASE_TABLET | Freq: Every day | ORAL | Status: DC | PRN

## 2024-06-07 MED ORDER — SODIUM CHLORIDE 0.9 % IV SOLN: INTRAVENOUS | Status: DC

## 2024-06-07 MED ORDER — SODIUM CHLORIDE 0.9 % IV SOLN
2.0000 g | Freq: Once | INTRAVENOUS | Status: AC
  Administered 2024-06-07: 2 g via INTRAVENOUS
  Filled 2024-06-07: qty 20

## 2024-06-07 MED ORDER — LACTATED RINGERS IV BOLUS
1000.0000 mL | Freq: Once | INTRAVENOUS | Status: AC
  Administered 2024-06-07: 1000 mL via INTRAVENOUS

## 2024-06-07 MED ORDER — VANCOMYCIN HCL 2000 MG/400ML IV SOLN
2000.0000 mg | Freq: Once | INTRAVENOUS | Status: AC
  Administered 2024-06-07: 2000 mg via INTRAVENOUS
  Filled 2024-06-07: qty 400

## 2024-06-07 MED ORDER — METOPROLOL SUCCINATE ER 25 MG PO TB24
25.0000 mg | ORAL_TABLET | Freq: Every day | ORAL | Status: DC
  Administered 2024-06-08 – 2024-06-09 (×2): 25 mg via ORAL
  Filled 2024-06-07 (×2): qty 1

## 2024-06-07 MED ORDER — DOCUSATE SODIUM 100 MG PO CAPS
100.0000 mg | ORAL_CAPSULE | Freq: Two times a day (BID) | ORAL | Status: DC
  Administered 2024-06-07 – 2024-06-09 (×3): 100 mg via ORAL
  Filled 2024-06-07 (×4): qty 1

## 2024-06-07 MED ORDER — LINEZOLID 600 MG/300ML IV SOLN
600.0000 mg | Freq: Two times a day (BID) | INTRAVENOUS | Status: DC
  Administered 2024-06-08: 600 mg via INTRAVENOUS
  Filled 2024-06-07: qty 300

## 2024-06-07 MED ORDER — FLUTICASONE PROPIONATE 50 MCG/ACT NA SUSP: 1.0000 | NASAL | Status: DC | PRN

## 2024-06-07 MED ORDER — ONDANSETRON HCL 4 MG PO TABS: 4.0000 mg | ORAL_TABLET | Freq: Four times a day (QID) | ORAL | Status: DC | PRN

## 2024-06-07 MED ORDER — ISOSORBIDE DINITRATE 20 MG PO TABS
30.0000 mg | ORAL_TABLET | Freq: Two times a day (BID) | ORAL | Status: DC
  Administered 2024-06-08 – 2024-06-09 (×3): 30 mg via ORAL
  Filled 2024-06-07 (×3): qty 1

## 2024-06-07 MED ORDER — ACETAMINOPHEN 325 MG PO TABS
650.0000 mg | ORAL_TABLET | Freq: Once | ORAL | Status: AC
  Administered 2024-06-07: 650 mg via ORAL
  Filled 2024-06-07: qty 2

## 2024-06-07 MED ORDER — ACETAMINOPHEN 650 MG RE SUPP: 650.0000 mg | Freq: Four times a day (QID) | RECTAL | Status: DC | PRN

## 2024-06-07 MED ORDER — BENAZEPRIL HCL 20 MG PO TABS
20.0000 mg | ORAL_TABLET | Freq: Every day | ORAL | Status: DC
  Administered 2024-06-08 – 2024-06-09 (×2): 20 mg via ORAL
  Filled 2024-06-07 (×2): qty 1

## 2024-06-07 MED ORDER — POLYETHYLENE GLYCOL 3350 17 G PO PACK: 17.0000 g | PACK | Freq: Every day | ORAL | Status: DC | PRN

## 2024-06-07 MED ORDER — LACTATED RINGERS IV BOLUS (SEPSIS): 500.0000 mL | Freq: Once | INTRAVENOUS | Status: DC

## 2024-06-07 MED ORDER — ONDANSETRON HCL 4 MG/2ML IJ SOLN: 4.0000 mg | Freq: Four times a day (QID) | INTRAMUSCULAR | Status: DC | PRN

## 2024-06-07 MED ORDER — OXYCODONE HCL 5 MG PO TABS
5.0000 mg | ORAL_TABLET | ORAL | Status: DC | PRN
  Administered 2024-06-08: 5 mg via ORAL
  Filled 2024-06-07 (×2): qty 1

## 2024-06-07 MED ORDER — GADOBUTROL 1 MMOL/ML IV SOLN
10.0000 mL | Freq: Once | INTRAVENOUS | Status: AC | PRN
  Administered 2024-06-07: 10 mL via INTRAVENOUS

## 2024-06-07 MED ORDER — LORAZEPAM 2 MG/ML IJ SOLN: 1.0000 mg | Freq: Once | INTRAMUSCULAR | Status: DC | PRN

## 2024-06-07 MED ORDER — HYDRALAZINE HCL 25 MG PO TABS
50.0000 mg | ORAL_TABLET | Freq: Two times a day (BID) | ORAL | Status: DC
  Administered 2024-06-07 – 2024-06-09 (×4): 50 mg via ORAL
  Filled 2024-06-07 (×4): qty 2

## 2024-06-07 MED ORDER — VANCOMYCIN HCL IN DEXTROSE 1-5 GM/200ML-% IV SOLN: 1000.0000 mg | Freq: Once | INTRAVENOUS | Status: DC

## 2024-06-07 MED ORDER — AMLODIPINE BESYLATE 5 MG PO TABS
10.0000 mg | ORAL_TABLET | Freq: Every day | ORAL | Status: DC
  Administered 2024-06-08 – 2024-06-09 (×2): 10 mg via ORAL
  Filled 2024-06-07 (×2): qty 2

## 2024-06-07 MED ORDER — ATORVASTATIN CALCIUM 40 MG PO TABS
40.0000 mg | ORAL_TABLET | Freq: Every day | ORAL | Status: DC
  Administered 2024-06-08 – 2024-06-09 (×2): 40 mg via ORAL
  Filled 2024-06-07 (×2): qty 1

## 2024-06-07 MED ORDER — SODIUM CHLORIDE 0.9 % IV SOLN
3.0000 g | Freq: Four times a day (QID) | INTRAVENOUS | Status: DC
  Administered 2024-06-08 – 2024-06-09 (×6): 3 g via INTRAVENOUS
  Filled 2024-06-07 (×7): qty 8

## 2024-06-07 MED ORDER — INSULIN ASPART 100 UNIT/ML IJ SOLN: 0.0000 [IU] | Freq: Every day | INTRAMUSCULAR | Status: DC

## 2024-06-07 NOTE — Sepsis Progress Note (Signed)
 eLink is following this Code Sepsis.

## 2024-06-07 NOTE — Assessment & Plan Note (Signed)
 There is no height or weight on file to calculate BMI..  Weight loss should be encouraged Outpatient PCP/bariatric medicine f/u encouraged Significantly low or high BMI is associated with higher medical risk including morbidity and mortality

## 2024-06-07 NOTE — Assessment & Plan Note (Signed)
 Appears to be stable at this time Hold Farxiga  while inpatient Attempt to avoid nephrotoxic medications Recheck BMP in AM

## 2024-06-07 NOTE — Progress Notes (Signed)
 Pharmacy Antibiotic Note  Omar Chen is a 79 y.o. male admitted on 06/07/2024 with cellulitis, r/o early osteomyelitis.  Pharmacy has been consulted for Unasyn dosing.  Plan: Unasyn 3g IV q6h  Follow up renal function, culture results, and clinical course.      Temp (24hrs), Avg:99.8 F (37.7 C), Min:98.6 F (37 C), Max:101.4 F (38.6 C)  Recent Labs  Lab 06/07/24 1035 06/07/24 1045 06/07/24 1427  WBC 15.6*  --   --   CREATININE 1.59*  --   --   LATICACIDVEN  --  2.1* 0.9    CrCl cannot be calculated (Unknown ideal weight.).    Allergies[1]  Antimicrobials this admission: 1/2 Ceftriaxone x1 1/2 Vanc x1 1/2 Unasyn >>   Dose adjustments this admission:  Microbiology results: 1/2 Resp panel: neg covid, flu, rsv 1/2 BCx:    Thank you for allowing pharmacy to be a part of this patients care.  Wanda Hasting PharmD, BCPS [facility] main pharmacy [phone] 06/07/2024 3:47 PM      [1] No Known Allergies

## 2024-06-07 NOTE — Assessment & Plan Note (Signed)
 He does not appear to be taking medications for this issue currently

## 2024-06-07 NOTE — ED Triage Notes (Signed)
 Pt arrives via [facility] from home c/o thoracic back pain that started yesterday evening. Denies known injury. No saddle anesthesia or bowel/bladder incontinence.

## 2024-06-07 NOTE — Assessment & Plan Note (Signed)
 Last A1c was 5.9 remotely, will recheck Appears to be diet controlled

## 2024-06-07 NOTE — Assessment & Plan Note (Signed)
"  Continue atorvastatin   "

## 2024-06-07 NOTE — Assessment & Plan Note (Signed)
 SIRS criteria in this patient includes: Leukocytosis, fever with source of R foot cellulitis (possible osteo) Patient has evidence of acute organ failure with elevated lactate >2 that is not easily explained by another condition. 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

## 2024-06-07 NOTE — Plan of Care (Signed)

## 2024-06-07 NOTE — H&P (Signed)
 " History and Physical    Patient: Omar Chen:[id number] DOB: [birth year] DOA: 06/07/2024 DOS: the patient was seen and examined on 06/07/2024 PCP: Charlott Dorn LABOR, MD  Patient coming from: Home - lives with wife; NOK: Omar Chen, [phone]   Chief Complaint: back pain  HPI: Omar Chen is a 79 y.o. male with medical history significant of DM, HTN, HLD, and morbid obesity who presented on 1/2 with back pain and generalized weakness.   He reports his legs wouldn't work today and he couldn't stand up.  He was fine yesterday.  No fever noticed at home.  His back was killing him, seems to be better.  He has had periodic legs issues for about a year, waxes and wanes.  The RLE is the issue now.    ER Course:  Sepsis due to cellulitis, ?early osteo of R foot.  Complained of weakness since yesterday and back pain but has had smoldering issues with stasis and cellulitis.  T101.4,WBC 15.6, lactate 2.1,  Xray with ?osteo.     Review of Systems: As mentioned in the history of present illness. All other systems reviewed and are negative. 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 No pertinent surgical history. Social History:  reports that he has never smoked. He has never used smokeless tobacco. He reports current alcohol use of about 1.0 standard drink of alcohol per week. He reports that he does not use drugs.  Allergies[1]  Family History  Problem Relation Age of Onset   Rheum arthritis Mother    Cancer - Lung Father    Pancreatic cancer Sister     Prior to Admission medications  Medication Sig Start Date End Date Taking? Authorizing Provider   amLODipine-benazepril (LOTREL) 10-20 MG capsule Take 1 capsule by mouth daily. 03/20/22   [provider]  atorvastatin (LIPITOR) 40 MG tablet Take 40 mg by mouth daily.    [provider]  FARXIGA  10 MG TABS tablet Take 1 tablet (10 mg total) by mouth daily. 09/14/23   Ladona Heinz, MD  fluticasone (FLONASE) 50 MCG/ACT nasal spray Place 1-2 sprays into the nose as needed for allergies. 09/09/14   [provider]  furosemide (LASIX) 20 MG tablet Take 20 mg by mouth daily as needed for edema.    [provider]  hydrALAZINE  (APRESOLINE ) 50 MG tablet TAKE 1 TABLET TWICE A DAY 01/23/24   Ladona Heinz, MD  isosorbide  dinitrate (ISORDIL ) 30 MG tablet Take 1 tablet (30 mg total) by mouth 2 (two) times daily. 10/02/23   Ladona Heinz, MD  metoprolol succinate (TOPROL-XL) 50 MG 24 hr tablet Take 25 tablets by mouth daily. 07/26/21   [provider]  olopatadine (PATANOL) 0.1 % ophthalmic solution Apply 1-2 drops to eye. 03/26/20   [provider]  Rivaroxaban  (XARELTO ) 15 MG TABS tablet Take 1 tablet (15 mg total) by mouth daily with supper. 09/14/23   Ladona Heinz, MD    Physical Exam: Vitals:   06/07/24 0919 06/07/24 1201 06/07/24 1217 06/07/24 1218  BP: 133/66 127/69    Pulse: 99 85    Resp: 16 16    Temp: (!) 101.4 F (38.6 C)  99.5 F (37.5 C)    TempSrc: Oral Oral    SpO2: 93% 92% 90% 95%   General:  Appears calm and comfortable and is in NAD Eyes:  PERRL, EOMI, normal lids, iris ENT:  grossly normal hearing, lips & tongue, mmm Neck:  no LAD, masses or thyromegaly Cardiovascular:  RRR. No LE edema.  Respiratory:   CTA bilaterally with no wheezes/rales/rhonchi.  Normal respiratory effort. Abdomen:  soft, NT, ND Skin:  chronic stasis dermatitis of BLE without obvious erythema; R dorsal foot erythema and peeling with some skin breaks  Musculoskeletal:  grossly normal tone BUE/BLE, good ROM, no bony abnormality Psychiatric:  grossly normal mood and  affect, speech fluent and appropriate, AOx3 Neurologic:  CN 2-12 grossly intact, moves all extremities in coordinated fashion   Radiological Exams on Admission: Independently reviewed - see discussion in A/P where applicable  DG Foot 2 Views Right Result Date: 06/07/2024 CLINICAL DATA:  Provided history: osteo to base of toes? Questionable sepsis. EXAM: RIGHT FOOT - 2 VIEW COMPARISON:  None Available. FINDINGS: Patchy areas of decreased density involving the second through fifth metatarsal heads, nonspecific. Chronic appearing midfoot deformities with cystic changes. No evidence of acute fracture. Small plantar calcaneal spur. Forefoot and midfoot soft tissue edema. No radiopaque foreign body or soft tissue gas. IMPRESSION: 1. Patchy areas of decreased density involving the second through fifth metatarsal heads, nonspecific. This could represent early osteomyelitis. Consider MRI for further assessment. 2. Chronic appearing midfoot deformities with cystic changes. 3. Forefoot and midfoot soft tissue edema. Electronically Signed   By: Andrea Gasman M.D.   On: 06/07/2024 11:59   DG Chest Port 1 View Result Date: 06/07/2024 CLINICAL DATA:  Provided history: Questionable sepsis - evaluate for abnormality EXAM: PORTABLE CHEST 1 VIEW COMPARISON:  08/16/2021 FINDINGS: Mild cardiomegaly is grossly stable. Prominence of both hila, also stable. No evidence of focal airspace disease, large pleural effusion or pneumothorax. No pulmonary edema. IMPRESSION: 1. Stable cardiomegaly. No acute findings. 2. Stable prominence of both hila, likely due to prominent pulmonary arteries. Electronically Signed   By: Andrea Gasman M.D.   On: 06/07/2024 11:57    EKG: Independently reviewed.  NSR with rate 96 with 1st degree AV block; nonspecific ST changes with no evidence of acute ischemia   Labs on Admission: I have personally reviewed the available labs and imaging studies at the time of the admission.  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 Sepsis protocol initiated Foot xray shows findings concerning for active infection osteitis He appears to have been given Keflex on 12/27 and so has failed outpatient management Will treat with IV antibiotics (Unasyn/Linezolid as per the lower extremity wound algorithm) I have ordered ABIs in case revascularization may  be indicated LE wound order set utilized including labs (CRP, ESR, A1c, prealbumin, HIV, and blood cultures) and consults (diabetes coordinator; peripheral vascular navigator; TOC team; wound care; and nutrition) Will place in observation status with telemetry and continue to monitor MRI ordered to further assess for osteo Paroxysmal atrial fibrillation (HCC) Rate controlled with Toprol XL Continue Xarelto  Benign essential hypertension Continue amlodipine, benazepril, hydralazine , Imdur, Toprol XL Benign prostatic hyperplasia He does not appear to be taking medications for this issue currently Mixed hyperlipidemia Continue atorvastatin 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  Weight loss should be encouraged Outpatient PCP/bariatric medicine f/u encouraged Significantly low or high BMI is associated with higher medical risk including morbidity and mortality         Advance Care Planning:   Code Status: Full Code - Code status was discussed with the patient at the time of admission.  The patient would want to receive full resuscitative measures at this time.    Consultants: Peripheral vascular navigator Dietician TOC team   Procedures: ABIs   Antibiotics: Ceftriaxone x 1  Unasyn 1/2- Vancomycin x 1 Linezolid 1/2-    DVT Prophylaxis: Xarelto   Family Communication: None present; we spoke with his wife by telephone while I was in the room  Severity of Illness: The appropriate patient status for this patient is OBSERVATION. Observation status is judged to be reasonable and necessary in order to provide the required intensity of service to ensure the patient's safety. The patient's presenting symptoms, physical exam findings, and initial radiographic and laboratory data in the context of their medical condition is felt to place them at decreased risk for further clinical deterioration. Furthermore, it is anticipated that the patient will be medically stable for discharge from the hospital within 2 midnights of admission.   Author: Delon Herald, MD 06/07/2024 3:23 PM  For on call review [email or web address].      [1] No Known Allergies  "

## 2024-06-07 NOTE — Assessment & Plan Note (Signed)
 Rate controlled with Toprol XL Continue Xarelto 

## 2024-06-07 NOTE — ED Provider Notes (Signed)
 " [place] EMERGENCY DEPARTMENT AT [facility] Provider Note   CSN: [id number] Arrival date & time: 06/07/24  9092     Patient presents with: Back Pain   Omar Chen is a 79 y.o. male.   79 year old male presenting emergency department with generalized weakness, also having some low back pain.  Typically able to ambulate independently, but today felt weak and had increased pain and low back after sleeping in recliner.  He is febrile, has been having issues with his lower  right foot for a week or 2.  No numbness tingling changes in sensation.  No headache, no chest pain shortness of breath no abdominal pain.  No bowel or bladder incontinence.  No saddle anesthesia.   Back Pain      Prior to Admission medications  Medication Sig Start Date End Date Taking? Authorizing Provider  amLODipine-benazepril (LOTREL) 10-20 MG capsule Take 1 capsule by mouth daily. 03/20/22   [provider]  atorvastatin (LIPITOR) 40 MG tablet Take 40 mg by mouth daily.    [provider]  FARXIGA  10 MG TABS tablet Take 1 tablet (10 mg total) by mouth daily. 09/14/23   Ladona Heinz, MD  fluticasone (FLONASE) 50 MCG/ACT nasal spray Place 1-2 sprays into the nose as needed for allergies. 09/09/14   [provider]  furosemide (LASIX) 20 MG tablet Take 20 mg by mouth daily as needed for edema.    [provider]  hydrALAZINE  (APRESOLINE ) 50 MG tablet TAKE 1 TABLET TWICE A DAY 01/23/24   Ladona Heinz, MD  isosorbide  dinitrate (ISORDIL ) 30 MG tablet Take 1 tablet (30 mg total) by mouth 2 (two) times daily. 10/02/23   Ladona Heinz, MD  metoprolol succinate (TOPROL-XL) 50 MG 24 hr tablet Take 25 tablets by mouth daily. 07/26/21   [provider]  olopatadine (PATANOL) 0.1 % ophthalmic solution Apply 1-2 drops to eye. 03/26/20   [provider]  Rivaroxaban  (XARELTO ) 15 MG TABS tablet Take 1 tablet (15 mg total) by mouth daily with supper. 09/14/23   Ladona Heinz, MD     Allergies: Patient has no known allergies.    Review of Systems  Musculoskeletal:  Positive for back pain.    Updated Vital Signs BP 127/69 (BP Location: Right Arm)   Pulse 85   Temp 99.5 F (37.5 C) (Oral)   Resp 16   SpO2 95%   Physical Exam Vitals and nursing note reviewed.  Constitutional:      Appearance: He is obese.  HENT:     Head: Normocephalic.     Mouth/Throat:     Mouth: Mucous membranes are moist.  Cardiovascular:     Rate and Rhythm: Normal rate. Rhythm irregular.     Pulses: Normal pulses.  Pulmonary:     Effort: Pulmonary effort is normal.     Breath sounds: Normal breath sounds.  Abdominal:     General: Abdomen is flat. There is no distension.     Palpations: Abdomen is soft.     Tenderness: There is no abdominal tenderness. There is no guarding or rebound.  Musculoskeletal:     Right lower leg: Edema present.     Left lower leg: Edema present.     Comments: Patient with erythema with some skin sloughing to his right dorsum of foot at the base of his toes.  Has some chronic venous stasis changes and edema to bilateral legs.  2+ DP pulses.  5-5 plantarflexion dorsiflexion, able to lift legs off  of bed.  Normal sensation.  Skin:    General: Skin is warm and dry.     Capillary Refill: Capillary refill takes less than 2 seconds.  Neurological:     Mental Status: He is alert and oriented to person, place, and time.  Psychiatric:        Mood and Affect: Mood normal.        Behavior: Behavior normal.     (all labs ordered are listed, but only abnormal results are displayed) Labs Reviewed  COMPREHENSIVE METABOLIC PANEL WITH GFR - Abnormal; Notable for the following components:      Result Value   Glucose, Bld 161 (*)    Creatinine, Ser 1.59 (*)    Calcium 8.8 (*)    Total Protein 6.4 (*)    Total Bilirubin 1.3 (*)    GFR, Estimated 44 (*)    All other components within normal limits  CBC WITH DIFFERENTIAL/PLATELET - Abnormal; Notable for the  following components:   WBC 15.6 (*)    Platelets 116 (*)    Neutro Abs 13.8 (*)    Lymphs Abs 0.3 (*)    Monocytes Absolute 1.2 (*)    Abs Immature Granulocytes 0.16 (*)    All other components within normal limits  PROTIME-INR - Abnormal; Notable for the following components:   Prothrombin Time 23.9 (*)    INR 2.0 (*)    All other components within normal limits  URINALYSIS, W/ REFLEX TO CULTURE (INFECTION SUSPECTED) - Abnormal; Notable for the following components:   Glucose, UA >=500 (*)    Hgb urine dipstick MODERATE (*)    Bacteria, UA RARE (*)    All other components within normal limits  I-STAT CG4 LACTIC ACID, ED - Abnormal; Notable for the following components:   Lactic Acid, Venous 2.1 (*)    All other components within normal limits  RESP PANEL BY RT-PCR (RSV, FLU A&B, COVID)  RVPGX2  CULTURE, BLOOD (ROUTINE X 2)  CULTURE, BLOOD (ROUTINE X 2)  I-STAT CG4 LACTIC ACID, ED    EKG: EKG Interpretation Date/Time:  Friday June 07 2024 09:20:30 EST Ventricular Rate:  96 PR Interval:  242 QRS Duration:  92 QT Interval:  370 QTC Calculation: 467 R Axis:   29  Text Interpretation: Sinus rhythm with 1st degree A-V block with Premature supraventricular complexes Otherwise normal ECG No previous ECGs available Confirmed by Neysa Clap [phone]) on 06/07/2024 1:31:29 PM  Radiology: [facility] Foot 2 Views Right Result Date: 06/07/2024 CLINICAL DATA:  Provided history: osteo to base of toes? Questionable sepsis. EXAM: RIGHT FOOT - 2 VIEW COMPARISON:  None Available. FINDINGS: Patchy areas of decreased density involving the second through fifth metatarsal heads, nonspecific. Chronic appearing midfoot deformities with cystic changes. No evidence of acute fracture. Small plantar calcaneal spur. Forefoot and midfoot soft tissue edema. No radiopaque foreign body or soft tissue gas. IMPRESSION: 1. Patchy areas of decreased density involving the second through fifth metatarsal heads,  nonspecific. This could represent early osteomyelitis. Consider MRI for further assessment. 2. Chronic appearing midfoot deformities with cystic changes. 3. Forefoot and midfoot soft tissue edema. Electronically Signed   By: Andrea Gasman M.D.   On: 06/07/2024 11:59   DG Chest Port 1 View Result Date: 06/07/2024 CLINICAL DATA:  Provided history: Questionable sepsis - evaluate for abnormality EXAM: PORTABLE CHEST 1 VIEW COMPARISON:  08/16/2021 FINDINGS: Mild cardiomegaly is grossly stable. Prominence of both hila, also stable. No evidence of focal airspace disease, large pleural effusion  or pneumothorax. No pulmonary edema. IMPRESSION: 1. Stable cardiomegaly. No acute findings. 2. Stable prominence of both hila, likely due to prominent pulmonary arteries. Electronically Signed   By: Andrea Gasman M.D.   On: 06/07/2024 11:57     .Critical Care  Performed by: Neysa Caron PARAS, DO Authorized by: Neysa Caron PARAS, DO   Critical care provider statement:    Critical care time (minutes):  30   Critical care was necessary to treat or prevent imminent or life-threatening deterioration of the following conditions:  Sepsis   Critical care was time spent personally by me on the following activities:  Development of treatment plan with patient or surrogate, discussions with consultants, evaluation of patient's response to treatment, examination of patient, ordering and review of laboratory studies, ordering and review of radiographic studies, ordering and performing treatments and interventions, pulse oximetry, re-evaluation of patient's condition and review of old charts    Medications Ordered in the ED  lidocaine (LIDODERM) 5 % 1 patch (1 patch Transdermal Patch Applied 06/07/24 1117)  lactated ringers infusion (has no administration in time range)  vancomycin (VANCOREADY) IVPB 2000 mg/400 mL (2,000 mg Intravenous New Bag/Given 06/07/24 1225)  acetaminophen (TYLENOL) tablet 650 mg (650 mg Oral Given 06/07/24  1118)  methocarbamol (ROBAXIN) tablet 500 mg (500 mg Oral Given 06/07/24 1118)  lactated ringers bolus 1,000 mL (1,000 mLs Intravenous New Bag/Given 06/07/24 1118)  cefTRIAXone (ROCEPHIN) 2 g in sodium chloride 0.9 % 100 mL IVPB (0 g Intravenous Stopped 06/07/24 1210)    Clinical Course as of 06/07/24 1352  Fri Jun 07, 2024  1116 WBC(!): 15.6 [TY]  1116 Lactic Acid, Venous(!!): 2.1 [TY]  1116 Temp(!): 101.4 F (38.6 C) [TY]  1119 Lactic Acid, Venous(!!): 2.1 [TY]  1119 Will start empiric antibiotics.  Suspect cellulitis from left foot as possible source. [TY]  1352 Spoke with hospitalist agrees to admit patient. [TY]    Clinical Course User Index [TY] Neysa Caron PARAS, DO                                 Medical Decision Making 79 year old male presenting emergency department for generalized weakness.  He is febrile at 1.4.  Mildly elevated heart rate, hemodynamically stable.  Maintaining oxygen saturation on room air physical exam with possible infection to his right foot.  Has had swelling issues over the past few years per family member, but not currently on antibiotics.  He has normal sensation in lower extremities and good strength.  His sepsis workup concerning for possible osteomyelitis on x-ray, has a leukocytosis of 15.6 and a mildly elevated lactate.  Started on Rocephin and vancomycin initially for cellulitis.  Comprehensive panel with no significant abnormalities, final elevation in his creatinine.  Flu/COVID/RSV negative.  UA not indicative of urinary tract infection.  Chest x-ray without pneumonia or pneumothorax.  EKG on my review without ischemic changes.  Patient treated with lidocaine patch, Tylenol and Robaxin with improvement of his back pain.  Given his signs of systemic infection will admit for further IV and biotics and treatment.  Amount and/or Complexity of Data Reviewed Independent Historian:     Details: Wife notes having issues on and off to foot for some time External  Data Reviewed:     Details: Is on Xarelto .  Per chart review from PCP has seen wound care in the past for his foot Labs: ordered. Decision-making details documented in ED Course. Radiology:  ordered and independent interpretation performed.    Details: Do not appreciate pneumothorax on chest x-ray  Risk OTC drugs. Prescription drug management. Drug therapy requiring intensive monitoring for toxicity. Decision regarding hospitalization. Diagnosis or treatment significantly limited by social determinants of health. Risk Details: Poor health literacy      Final diagnoses:  Sepsis, due to unspecified organism, unspecified whether acute organ dysfunction present [facility])    ED Discharge Orders     None          Neysa Caron PARAS, DO 06/07/24 1332  "

## 2024-06-07 NOTE — Assessment & Plan Note (Signed)
 Continue amlodipine, benazepril, hydralazine , Imdur, Toprol XL

## 2024-06-08 ENCOUNTER — Observation Stay (HOSPITAL_COMMUNITY)

## 2024-06-08 DIAGNOSIS — M79674 Pain in right toe(s): Secondary | ICD-10-CM

## 2024-06-08 DIAGNOSIS — M549 Dorsalgia, unspecified: Secondary | ICD-10-CM | POA: Insufficient documentation

## 2024-06-08 DIAGNOSIS — A419 Sepsis, unspecified organism: Secondary | ICD-10-CM

## 2024-06-08 DIAGNOSIS — L0390 Cellulitis, unspecified: Secondary | ICD-10-CM

## 2024-06-08 DIAGNOSIS — G4733 Obstructive sleep apnea (adult) (pediatric): Secondary | ICD-10-CM | POA: Diagnosis present

## 2024-06-08 LAB — BASIC METABOLIC PANEL WITH GFR
Anion gap: 10 (ref 5–15)
BUN: 17 mg/dL (ref 8–23)
CO2: 27 mmol/L (ref 22–32)
Calcium: 8.5 mg/dL — ABNORMAL LOW (ref 8.9–10.3)
Chloride: 105 mmol/L (ref 98–111)
Creatinine, Ser: 1.4 mg/dL — ABNORMAL HIGH (ref 0.61–1.24)
GFR, Estimated: 52 mL/min — ABNORMAL LOW
Glucose, Bld: 109 mg/dL — ABNORMAL HIGH (ref 70–99)
Potassium: 3.1 mmol/L — ABNORMAL LOW (ref 3.5–5.1)
Sodium: 142 mmol/L (ref 135–145)

## 2024-06-08 LAB — GLUCOSE, CAPILLARY
Glucose-Capillary: 114 mg/dL — ABNORMAL HIGH (ref 70–99)
Glucose-Capillary: 135 mg/dL — ABNORMAL HIGH (ref 70–99)
Glucose-Capillary: 96 mg/dL (ref 70–99)
Glucose-Capillary: 124 mg/dL — ABNORMAL HIGH (ref 70–99)

## 2024-06-08 LAB — CBC
HCT: 41.4 % (ref 39.0–52.0)
Hemoglobin: 14 g/dL (ref 13.0–17.0)
MCH: 32.6 pg (ref 26.0–34.0)
MCHC: 33.8 g/dL (ref 30.0–36.0)
MCV: 96.3 fL (ref 80.0–100.0)
Platelets: 107 K/uL — ABNORMAL LOW (ref 150–400)
RBC: 4.3 MIL/uL (ref 4.22–5.81)
RDW: 13.8 % (ref 11.5–15.5)
WBC: 8.6 K/uL (ref 4.0–10.5)
nRBC: 0 % (ref 0.0–0.2)

## 2024-06-08 MED ORDER — ADULT MULTIVITAMIN W/MINERALS CH
1.0000 | ORAL_TABLET | Freq: Every day | ORAL | Status: DC
  Administered 2024-06-09: 1 via ORAL
  Filled 2024-06-08: qty 1

## 2024-06-08 MED ORDER — POTASSIUM CHLORIDE CRYS ER 20 MEQ PO TBCR
40.0000 meq | EXTENDED_RELEASE_TABLET | Freq: Once | ORAL | Status: AC
  Administered 2024-06-08: 40 meq via ORAL
  Filled 2024-06-08: qty 2

## 2024-06-08 MED ORDER — ENSURE MAX PROTEIN PO LIQD
11.0000 [oz_av] | Freq: Two times a day (BID) | ORAL | Status: DC
  Administered 2024-06-08 – 2024-06-09 (×2): 11 [oz_av] via ORAL
  Filled 2024-06-08 (×3): qty 330

## 2024-06-08 MED ORDER — METHOCARBAMOL 500 MG PO TABS
500.0000 mg | ORAL_TABLET | Freq: Four times a day (QID) | ORAL | Status: DC | PRN
  Administered 2024-06-08: 500 mg via ORAL
  Filled 2024-06-08: qty 1

## 2024-06-08 MED ORDER — LIDOCAINE 5 % EX PTCH
1.0000 | MEDICATED_PATCH | CUTANEOUS | Status: DC
  Administered 2024-06-08: 1 via TRANSDERMAL
  Filled 2024-06-08: qty 1

## 2024-06-08 NOTE — Assessment & Plan Note (Addendum)
 He does not appear to be taking medications for this issue currently

## 2024-06-08 NOTE — Assessment & Plan Note (Addendum)
 Body mass index is 38.47 kg/m.SABRA  Weight loss should be encouraged Outpatient PCP/bariatric medicine f/u encouraged Significantly low or high BMI is associated with higher medical risk including morbidity and mortality

## 2024-06-08 NOTE — Progress Notes (Addendum)
 " Progress Note   Patient: Omar Chen FMW:[id number] DOB: 02-15-1946 DOA: 06/07/2024     0 DOS: the patient was seen and examined on 06/08/2024   Brief hospital course: 78yo with h/o DM, HTN, HLD, and morbid obesity who presented on 1/2 with generalized weakness.  He was febrile on presentation to 101.4 with concern for sepsis due to R foot cellulitis.  Xray concerning for osteomyelitis; MRI is pending.  ABIs ordered.  Started on Unasyn  and Linezolid .    Assessment & Plan Sepsis due to cellulitis (HCC) SIRS criteria in this patient includes: Leukocytosis, fever with source of R foot cellulitis (possible osteo) Patient had evidence of acute organ failure with elevated lactate >2 that is not easily explained by another condition. 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 He appears to have been given Keflex on 12/27 and so has failed outpatient management Treating with antibiotics (Unasyn /Linezolid ) as per the lower extremity wound algorithm ABIs negative LE wound order set utilized including labs (CRP, ESR, A1c, prealbumin, HIV, and blood cultures) and consults (diabetes coordinator; peripheral vascular navigator; TOC team; wound care; and nutrition) Will place in observation status with telemetry and continue to monitor MRI ordered to further assess for osteo - still pending; I called radiology and was told that there is no [facility] radiologist available and they will try to get a preliminary read and find someone to read the study Cellulitis appears to be improving Back pain Reported back pain on presentation He is sedentary and obese at baseline No obvious trauma Reports mostly low back pain, particularly with movement Will see how he does with PT/OT to decide if additional evaluation is needed Lidocaine  and Robaxin  plus Tylenol  for now as needed Paroxysmal atrial fibrillation (HCC) Rate controlled with  Toprol  XL Continue Xarelto  Benign essential hypertension Continue amlodipine , benazepril , hydralazine , Imdur, Toprol  XL Benign prostatic hyperplasia He does not appear to be taking medications for this issue currently Mixed hyperlipidemia Continue atorvastatin  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) Last A1c was 5.9 remotely, will recheck Appears to be diet controlled Chronic kidney disease, stage 3b (HCC) Appears to be stable at this time Hold Farxiga  while inpatient Attempt to avoid nephrotoxic medications Recheck BMP in AM  OSA (obstructive sleep apnea) Wears CPAP at home His wife is concerned that his CPAP is not working well at home Will order CPAP here She is also welcome to bring in the machine here Class 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  Back pain is improved.  Wife is curious about a nuclear scan to assess for whole body inflammation given his prior h/o hernia mesh infections (he refused to have surgical removal).  Wife would like for [facility] wound care at the time of dc.  He is unsure if he would agree to amputation even if it was recommended.   Objective: Vitals:   06/08/24 0904 06/08/24 1425  BP: 134/68 (!) 135/57  Pulse:  74  Resp:  18  Temp:  99.2 F (37.3 C)  SpO2:      Intake/Output Summary (Last 24 hours) at 06/08/2024 1503 Last data filed at 06/08/2024 0813 Gross per 24 hour  Intake 2349.73 ml  Output 2150 ml  Net  199.73 ml   Filed Weights   06/07/24 1534  Weight: 121.6 kg    Exam:  General:  Appears calm and comfortable and is in NAD Eyes:  normal lids, iris ENT:  grossly normal hearing, lips & tongue, mmm Cardiovascular:  RRR. No LE edema.  Respiratory:   CTA bilaterally with no wheezes/rales/rhonchi.  Normal respiratory effort. Abdomen:  soft, NT, ND Skin:  R foot rash is significant improved Musculoskeletal:  grossly normal tone BUE/BLE, good ROM, no bony abnormality Psychiatric:  grossly normal mood and affect, speech fluent and appropriate, AOx3 Neurologic:  CN 2-12 grossly intact, moves all extremities in coordinated fashion  Data Reviewed: I have reviewed the patient's lab results since admission.  Pertinent labs for today include:  K 3.1 Glucose 109 BUN 17/Creatinine 1.4/GFR 52 WBC 8.6 Platelets 106    Family Communication: None present at first; wife was present with lots of questions when I went back  Mobility: PT/OT Consulted      Code Status: Full Code    Disposition: Status is: Inpatient Admit - It is my clinical opinion that admission to INPATIENT is reasonable and necessary because of the expectation that this patient will require hospital care that crosses at least 2 midnights to treat this condition based on the medical complexity of the problems presented.  Given the aforementioned information, the predictability of an adverse outcome is felt to be significant.      Time spent: 50 minutes  Unresulted Labs (From admission, onward)     Start     Ordered   06/09/24 0500  CBC  Tomorrow morning,   R        06/08/24 1502   06/09/24 0500  Basic metabolic panel with GFR  Tomorrow morning,   R        06/08/24 1502             Author: Delon Herald, MD 06/08/2024 3:03 PM  For on call review [email or web address].            "

## 2024-06-08 NOTE — Progress Notes (Signed)
" °   06/08/24 2243  BiPAP/CPAP/SIPAP  $ Non-Invasive Home Ventilator  Initial  BiPAP/CPAP/SIPAP Pt Type Adult  BiPAP/CPAP/SIPAP Resmed  Mask Type Nasal mask  Dentures removed? Not applicable  Mask Size Large  Respiratory Rate 18 breaths/min  Pressure Support 5 cmH20  FiO2 (%) 21 %  Patient Home Machine No  Patient Home Mask No  Patient Home Tubing No  Auto Titrate Yes  Minimum cmH2O 5 cmH2O  Maximum cmH2O 15 cmH2O  Nasal massage performed Yes  CPAP/SIPAP surface wiped down Yes  Device Plugged into RED Power Outlet Yes  BiPAP/CPAP /SiPAP Vitals  Pulse Rate 73  Resp 18  SpO2 92 %  Bilateral Breath Sounds Clear;Diminished    "

## 2024-06-08 NOTE — Assessment & Plan Note (Addendum)
 Appears to be stable at this time Hold Farxiga  while inpatient Attempt to avoid nephrotoxic medications Recheck BMP in AM

## 2024-06-08 NOTE — Progress Notes (Signed)
 Initial Nutrition Assessment  DOCUMENTATION CODES:   Obesity unspecified  INTERVENTION:  - Liberalize to Carb Modified diet.  - Ensure Max po BID, each supplement provides 150 kcal and 30 grams of protein. - Multivitamin with minerals daily.   NUTRITION DIAGNOSIS:   Increased nutrient needs related to wound healing as evidenced by estimated needs  GOAL:   Patient will meet greater than or equal to 90% of their needs  MONITOR:   PO intake, Supplement acceptance, Skin  REASON FOR ASSESSMENT:   Consult Wound healing  ASSESSMENT:   79 y.o. male with PMH DM, HTN, HLD, and morbid obesity who presented with generalized weakness. Admitted for sepsis due to right foot cellulitis.   RD working remotely. Called patient via bedside telephone to obtain nutrition history but unable to reach patient.   Per EMR, weight has trended up ~10kg over the past year.   Patient currently on a heart healthy/carb modified diet. No meal intakes documented at this time. Will liberalize diet to carb modified and add Ensure Max to support intake.    Medications reviewed and include: Colace  Labs reviewed:  K+ 3.1 Creatinine 1.40 HA1C 5.7 Blood Glucose 114-135 x24 hours  NUTRITION - FOCUSED PHYSICAL EXAM:  Unable to obtain - RD working remotely  Diet Order:   Diet Order             Diet heart healthy/carb modified Room service appropriate? Yes; Fluid consistency: Thin  Diet effective now                   EDUCATION NEEDS:  Not appropriate for education at this time  Skin:  Skin Assessment: Reviewed RN Assessment  Last BM:  PTA  Height:  Ht Readings from Last 1 Encounters:  06/07/24 5' 10 (1.778 m)   Weight:  Wt Readings from Last 1 Encounters:  06/07/24 121.6 kg   Ideal Body Weight:  75.45 kg  BMI:  Body mass index is 38.47 kg/m.  Estimated Nutritional Needs:  Kcal:  1950-2350 kcals Protein:  100-115 grams Fluid:  >/= 2L    Trude Ned RD, LDN Contact  via Secure Chat.

## 2024-06-08 NOTE — Evaluation (Signed)
 Physical Therapy Evaluation Patient Details Name: Omar Chen MRN: [id number] DOB: 1946/04/16 Today's Date: 06/08/2024  History of Present Illness  78yo  who presented on 1/2 with generalized weakness. He was febrile with concern for sepsis due to R foot cellulitis. Xray concerning for osteomyelitis; MRI is pending. h/o DM, HTN, HLD, and morbid obesity  Clinical Impression  Pt admitted with above diagnosis.  Pt is independent but somewhat sedentary at baseline. C/o back pain with any activity. Able to amb 60' with RW, min assist to CGA for safety, reports LE fatigue with above distance. Pt educated on log roll and positioning in supine for back comfort. Pt seems to be having spasms in lumbar region and reports majority of pain in this area, no radicular pain.  Will continue to follow in acute setting, pt may benefit from HHPT vs OPPT for if back pain persists.  Pt currently with functional limitations due to the deficits listed below (see PT Problem List). Pt will benefit from acute skilled PT to increase their independence and safety with mobility to allow discharge.           If plan is discharge home, recommend the following: A little help with walking and/or transfers;A little help with bathing/dressing/bathroom;Assistance with cooking/housework;Help with stairs or ramp for entrance;Assist for transportation   Can travel by private vehicle        Equipment Recommendations None recommended by PT  Recommendations for Other Services       Functional Status Assessment Patient has had a recent decline in their functional status and demonstrates the ability to make significant improvements in function in a reasonable and predictable amount of time.     Precautions / Restrictions Precautions Precautions: Fall;Back Precaution/Restrictions Comments: back precautions for comfort Restrictions Weight Bearing Restrictions Per Provider Order: No      Mobility  Bed Mobility Overal bed  mobility: Needs Assistance Bed Mobility: Rolling, Sidelying to Sit, Sit to Sidelying Rolling: Min assist Sidelying to sit: Min assist     Sit to sidelying: Min assist General bed mobility comments: cues to log roll, assist to progress LEs off and on to bed    Transfers Overall transfer level: Needs assistance Equipment used: Rolling walker (2 wheels) Transfers: Sit to/from Stand, Bed to chair/wheelchair/BSC Sit to Stand: Min assist, Contact guard assist   Step pivot transfers: Min assist       General transfer comment: STS from bed and recliner, min assist from lower surface. cues for hand placement    Ambulation/Gait Ambulation/Gait assistance: Min assist, Contact guard assist Gait Distance (Feet): 60 Feet Assistive device: Rolling walker (2 wheels) Gait Pattern/deviations: Step-through pattern, Decreased stride length, Wide base of support, Trunk flexed Gait velocity: decr but functional     General Gait Details: cues for trunk/hip extension and RW proximity  Stairs            Wheelchair Mobility     Tilt Bed    Modified Rankin (Stroke Patients Only)       Balance Overall balance assessment: Needs assistance, History of Falls Sitting-balance support: Feet supported, No upper extremity supported Sitting balance-Leahy Scale: Fair Sitting balance - Comments: testing limited d/t back pain   Standing balance support: During functional activity, Reliant on assistive device for balance, Single extremity supported Standing balance-Leahy Scale: Poor Standing balance comment: reliant on at least unilateral UE support  Pertinent Vitals/Pain Pain Assessment Pain Assessment: Faces Pain Score: 8  Pain Location: back and thighs Pain Descriptors / Indicators: Sore Pain Intervention(s): Repositioned, Limited activity within patient's tolerance, Monitored during session, Premedicated before session    Home Living  Family/patient expects to be discharged to:: Private residence Living Arrangements: Spouse/significant other Available Help at Discharge: Family Type of Home: House Home Access: Stairs to enter Entrance Stairs-Rails: Right Entrance Stairs-Number of Steps: 6-8 (or 10)   Home Layout: One level Home Equipment: Agricultural Consultant (2 wheels)      Prior Function Prior Level of Function : Independent/Modified Independent                     Extremity/Trunk Assessment   Upper Extremity Assessment Upper Extremity Assessment: Overall WFL for tasks assessed    Lower Extremity Assessment Lower Extremity Assessment: Overall WFL for tasks assessed       Communication   Communication Communication: No apparent difficulties    Cognition Arousal: Alert Behavior During Therapy: WFL for tasks assessed/performed   PT - Cognitive impairments: No apparent impairments                         Following commands: Intact       Cueing Cueing Techniques: Verbal cues     General Comments      Exercises     Assessment/Plan    PT Assessment Patient needs continued PT services  PT Problem List Decreased mobility;Decreased activity tolerance;Decreased balance;Decreased knowledge of use of DME;Pain       PT Treatment Interventions DME instruction;Therapeutic exercise;Gait training;Functional mobility training;Therapeutic activities;Patient/family education    PT Goals (Current goals can be found in the Care Plan section)  Acute Rehab PT Goals Patient Stated Goal: feel better and be able to make martinis PT Goal Formulation: With patient/family Time For Goal Achievement: 06/22/24 Potential to Achieve Goals: Good    Frequency Min 3X/week     Co-evaluation               AM-PAC PT 6 Clicks Mobility  Outcome Measure Help needed turning from your back to your side while in a flat bed without using bedrails?: A Little Help needed moving from lying on your back  to sitting on the side of a flat bed without using bedrails?: A Little Help needed moving to and from a bed to a chair (including a wheelchair)?: A Little Help needed standing up from a chair using your arms (e.g., wheelchair or bedside chair)?: A Little Help needed to walk in hospital room?: A Little Help needed climbing 3-5 steps with a railing? : A Lot 6 Click Score: 17    End of Session Equipment Utilized During Treatment: Gait belt Activity Tolerance: Patient tolerated treatment well Patient left: in bed;with call bell/phone within reach;with bed alarm set Nurse Communication: Mobility status PT Visit Diagnosis: Other abnormalities of gait and mobility (R26.89);Pain Pain - part of body:  (back)    Time: 8456-8386 PT Time Calculation (min) (ACUTE ONLY): 30 min   Charges:   PT Evaluation $PT Eval Low Complexity: 1 Low PT Treatments $Gait Training: 8-22 mins PT General Charges $$ ACUTE PT VISIT: 1 Visit         Judd Mccubbin, PT  Acute Rehab Dept [facility]) [phone]  06/08/2024   [facility] 06/08/2024, 4:26 PM

## 2024-06-08 NOTE — Assessment & Plan Note (Addendum)
"  Continue atorvastatin   "

## 2024-06-08 NOTE — Assessment & Plan Note (Addendum)
 Continue amlodipine, benazepril, hydralazine , Imdur, Toprol XL

## 2024-06-08 NOTE — Assessment & Plan Note (Addendum)
 Last A1c was 5.9 remotely, will recheck Appears to be diet controlled

## 2024-06-08 NOTE — Plan of Care (Signed)
" °  Problem: Clinical Measurements: Goal: Will remain free from infection Outcome: Not Progressing   Problem: Activity: Goal: Risk for activity intolerance will decrease Outcome: Not Progressing   Problem: Skin Integrity: Goal: Risk for impaired skin integrity will decrease Outcome: Not Progressing   Problem: Skin Integrity: Goal: Risk for impaired skin integrity will decrease Outcome: Not Progressing   "

## 2024-06-08 NOTE — Progress Notes (Signed)
 Ankle brachial index has been completed.  Results can be found in chart review under CV Proc.  06/08/2024 10:47 AM  [place] [employer], RVT.

## 2024-06-08 NOTE — Assessment & Plan Note (Signed)
 Wears CPAP at home His wife is concerned that his CPAP is not working well at home Will order CPAP here She is also welcome to bring in the machine here

## 2024-06-08 NOTE — Assessment & Plan Note (Addendum)
 SIRS criteria in this patient includes: Leukocytosis, fever with source of R foot cellulitis (possible osteo) Patient had evidence of acute organ failure with elevated lactate >2 that is not easily explained by another condition. 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 He appears to have been given Keflex on 12/27 and so has failed outpatient management Treating with antibiotics (Unasyn /Linezolid ) as per the lower extremity wound algorithm ABIs negative LE wound order set utilized including labs (CRP, ESR, A1c, prealbumin, HIV, and blood cultures) and consults (diabetes coordinator; peripheral vascular navigator; TOC team; wound care; and nutrition) Will place in observation status with telemetry and continue to monitor MRI ordered to further assess for osteo - still pending; I called radiology and was told that there is no [facility] radiologist available and they will try to get a preliminary read and find someone to read the study Cellulitis appears to be improving

## 2024-06-08 NOTE — Assessment & Plan Note (Addendum)
 Rate controlled with Toprol XL Continue Xarelto 

## 2024-06-08 NOTE — Hospital Course (Signed)
 78yo with h/o DM, HTN, HLD, and morbid obesity who presented on 1/2 with generalized weakness.  He was febrile on presentation to 101.4 with concern for sepsis due to R foot cellulitis.  Xray concerning for osteomyelitis; MRI is pending.  ABIs ordered.  Started on Unasyn  and Linezolid .

## 2024-06-08 NOTE — Assessment & Plan Note (Addendum)
 Reported back pain on presentation He is sedentary and obese at baseline No obvious trauma Reports mostly low back pain, particularly with movement Will see how he does with PT/OT to decide if additional evaluation is needed Lidocaine  and Robaxin  plus Tylenol  for now as needed

## 2024-06-09 ENCOUNTER — Other Ambulatory Visit (HOSPITAL_COMMUNITY): Payer: Self-pay

## 2024-06-09 DIAGNOSIS — L0390 Cellulitis, unspecified: Secondary | ICD-10-CM | POA: Diagnosis not present

## 2024-06-09 DIAGNOSIS — R638 Other symptoms and signs concerning food and fluid intake: Secondary | ICD-10-CM | POA: Insufficient documentation

## 2024-06-09 DIAGNOSIS — A419 Sepsis, unspecified organism: Secondary | ICD-10-CM | POA: Diagnosis not present

## 2024-06-09 LAB — CBC
HCT: 42.1 % (ref 39.0–52.0)
Hemoglobin: 14.2 g/dL (ref 13.0–17.0)
MCH: 32.3 pg (ref 26.0–34.0)
MCHC: 33.7 g/dL (ref 30.0–36.0)
MCV: 95.9 fL (ref 80.0–100.0)
Platelets: 112 K/uL — ABNORMAL LOW (ref 150–400)
RBC: 4.39 MIL/uL (ref 4.22–5.81)
RDW: 13.4 % (ref 11.5–15.5)
WBC: 7.6 K/uL (ref 4.0–10.5)
nRBC: 0 % (ref 0.0–0.2)

## 2024-06-09 LAB — VAS US ABI WITH/WO TBI
Left ABI: 1
Right ABI: 1.01

## 2024-06-09 LAB — BASIC METABOLIC PANEL WITH GFR
Anion gap: 12 (ref 5–15)
BUN: 16 mg/dL (ref 8–23)
CO2: 23 mmol/L (ref 22–32)
Calcium: 8.4 mg/dL — ABNORMAL LOW (ref 8.9–10.3)
Chloride: 105 mmol/L (ref 98–111)
Creatinine, Ser: 1.28 mg/dL — ABNORMAL HIGH (ref 0.61–1.24)
GFR, Estimated: 58 mL/min — ABNORMAL LOW
Glucose, Bld: 117 mg/dL — ABNORMAL HIGH (ref 70–99)
Potassium: 3.9 mmol/L (ref 3.5–5.1)
Sodium: 140 mmol/L (ref 135–145)

## 2024-06-09 LAB — GLUCOSE, CAPILLARY
Glucose-Capillary: 117 mg/dL — ABNORMAL HIGH (ref 70–99)
Glucose-Capillary: 118 mg/dL — ABNORMAL HIGH (ref 70–99)

## 2024-06-09 MED ORDER — ADULT MULTIVITAMIN W/MINERALS CH
1.0000 | ORAL_TABLET | Freq: Every day | ORAL | 0 refills | Status: AC
  Filled 2024-06-09: qty 30, 30d supply, fill #0

## 2024-06-09 MED ORDER — LINEZOLID 600 MG PO TABS
600.0000 mg | ORAL_TABLET | Freq: Two times a day (BID) | ORAL | 0 refills | Status: AC
  Filled 2024-06-09: qty 10, 5d supply, fill #0

## 2024-06-09 MED ORDER — ACETAMINOPHEN 325 MG PO TABS: 650.0000 mg | ORAL_TABLET | Freq: Four times a day (QID) | ORAL | Status: AC | PRN

## 2024-06-09 MED ORDER — OXYCODONE HCL 5 MG PO TABS
5.0000 mg | ORAL_TABLET | ORAL | 0 refills | Status: AC | PRN
  Filled 2024-06-09: qty 20, 4d supply, fill #0

## 2024-06-09 MED ORDER — LIDOCAINE 5 % EX PTCH
1.0000 | MEDICATED_PATCH | CUTANEOUS | 0 refills | Status: AC
  Filled 2024-06-09: qty 30, 30d supply, fill #0

## 2024-06-09 MED ORDER — METHOCARBAMOL 500 MG PO TABS
500.0000 mg | ORAL_TABLET | Freq: Four times a day (QID) | ORAL | 0 refills | Status: AC | PRN
  Filled 2024-06-09: qty 30, 8d supply, fill #0

## 2024-06-09 MED ORDER — ENSURE MAX PROTEIN PO LIQD
11.0000 [oz_av] | Freq: Two times a day (BID) | ORAL | 0 refills | Status: AC
  Filled 2024-06-09: qty 19800, 30d supply, fill #0

## 2024-06-09 NOTE — Progress Notes (Signed)
 Pt discharging with PTAR soon, IV removed, education done, TOC meds to bed, no equip was ordered per PT who saw him this morning patient does not need it at home.    Burel Kahre C Warden, RN 06/09/2024 1:26 PM

## 2024-06-09 NOTE — TOC Transition Note (Signed)
 Transition of Care [facility]) - Discharge Note   Patient Details  Name: Omar Chen MRN: [id number] Date of Birth: 1946/03/13  Transition of Care [facility]) CM/SW Contact:  Hoy DELENA Bigness, LCSW Phone Number: 06/09/2024, 12:02 PM   Clinical Narrative:    Pt to return home with HHPT/OT/RN provided by [facility]. Pt unable to get in and out of car and will require EMS transport home. PTAR called at [phone] for transport.    Final next level of care: Home w Home Health Services Barriers to Discharge: Barriers Resolved   Patient Goals and CMS Choice Patient states their goals for this hospitalization and ongoing recovery are:: To return home CMS Medicare.gov Compare Post Acute Care list provided to:: Patient Choice offered to / list presented to : Patient      Discharge Placement                Patient to be transferred to facility by: PTAR      Discharge Plan and Services Additional resources added to the After Visit Summary for                  DME Arranged: N/A DME Agency: NA       HH Arranged: PT, OT, RN HH Agency: [facility] Date [facility] Agency Contacted: 06/09/24 Time HH Agency Contacted: 1202    Social Drivers of Health (SDOH) Interventions SDOH Screenings   Food Insecurity: No Food Insecurity (06/07/2024)  Housing: Low Risk (06/07/2024)  Transportation Needs: No Transportation Needs (06/07/2024)  Utilities: Not At Risk (06/07/2024)  Depression (PHQ2-9): Low Risk (02/21/2022)  Social Connections: Socially Integrated (06/07/2024)  Tobacco Use: Low Risk (06/07/2024)     Readmission Risk Interventions    06/09/2024   11:45 AM  Readmission Risk Prevention Plan  Transportation Screening Complete  PCP or Specialist Appt within 5-7 Days Complete  Home Care Screening Complete  Medication Review (RN CM) Complete

## 2024-06-09 NOTE — Discharge Summary (Addendum)
 " Physician Discharge Summary   Patient: Omar Chen MRN: [id number] DOB: [birth year]  Admit date:     06/07/2024  Discharge date: 06/09/2024  Discharge Physician: Delon Herald   PCP: Charlott Dorn LABOR, MD   Recommendations at discharge:   You are being discharged with home physical and occupational therapy as well as home health aide and RN for wound care Complete antibiotics (Zyvox /linezolid  twice daily for another 5 days) Follow up with Dr. 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  He was febrile on presentation to 101.4 with concern for sepsis due to R foot cellulitis.  Xray concerning for osteomyelitis; MRI is pending.  ABIs ordered.  Started on Unasyn  and Linezolid .  Assessment and Plan:  Assessment & Plan Sepsis due to cellulitis (HCC) SIRS criteria in this patient includes: Leukocytosis, fever with source of R foot cellulitis (possible osteo) Patient had evidence of acute organ failure with elevated lactate >2 that is not easily explained by another condition. 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 He appears to have been given  Keflex on 12/27 and so has failed outpatient management Treating with antibiotics (Unasyn ) as per the lower extremity wound algorithm -> PO Linezolid  to complete 7 days of treatment ABIs negative LE wound order set utilized including labs (CRP, ESR, A1c, prealbumin, HIV, and blood cultures) and consults (diabetes coordinator; peripheral vascular navigator; TOC team; wound care; and nutrition) Will place in observation status with telemetry and continue to monitor MRI negative for osteo Cellulitis appears to be improving Generalized weakness associated with above; will need HH PT/OT, RN for wound care, and aide Will order clotrimazole cream for foot for fungal component Back pain Reported back pain on presentation He is sedentary and obese at baseline No obvious trauma Reports mostly low back pain, particularly with movement Continue Lidocaine  and Robaxin  plus Tylenol  for now as needed Paroxysmal atrial fibrillation (HCC) Rate controlled with Toprol  XL Continue Xarelto  Benign essential hypertension Continue amlodipine , benazepril , hydralazine , Imdur, Toprol  XL Benign prostatic hyperplasia He does not appear to be taking medications for this issue currently Mixed hyperlipidemia Continue atorvastatin  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) Last A1c was 5.9 remotely, will recheck Appears to be diet controlled Chronic kidney disease, stage 3b (HCC) Appears to be stable at this time Resume Farxiga  Attempt to avoid nephrotoxic medications OSA (obstructive sleep apnea) Wears CPAP at home His wife is concerned that his CPAP is not working well at home Recommend outpatient sleep study Increased nutritional needs Nutrition Problem: Increased nutrient needs Etiology: wound healing Signs/Symptoms: estimated needs Interventions: Refer to RD note for recommendations, MVI, Premier Protein Class 2 obesity due to excess calories  with body mass index (BMI) of 38.0 to 38.9  in adult Body mass index is 38.47 kg/m.SABRA  Weight loss should be encouraged Outpatient PCP/bariatric medicine f/u encouraged Significantly low or high BMI is associated with higher medical risk including morbidity and mortality       Consultants: Peripheral vascular navigator Dietician TOC team   Procedures: ABIs   Antibiotics: Ceftriaxone  x 1  Unasyn  1/2-4 Vancomycin  x 1 Linezolid  x 1 dose Augmentin 1/4-8   Pain control - [place]  Controlled Substance Reporting System database was reviewed. and patient was instructed, not to drive, operate heavy machinery, perform activities at heights, swimming or participation in water activities or provide baby-sitting services while on Pain, Sleep and Anxiety Medications; until their outpatient Physician has advised to do so again. Also recommended to not to take more than prescribed Pain, Sleep and Anxiety Medications.   Disposition: Home Diet recommendation:  Cardiac and Carb modified diet DISCHARGE MEDICATION: Allergies as of 06/09/2024   No Known Allergies      Medication List     STOP taking these medications    cephALEXin 500 MG capsule Commonly known as: KEFLEX       TAKE these medications    acetaminophen  325 MG tablet Commonly known as: TYLENOL  Take 2 tablets (650 mg total) by mouth every 6 (six) hours as needed for mild pain (pain score 1-3) (or Fever >/= 101).   amLODipine -benazepril  10-20 MG capsule Commonly known as: LOTREL Take 1 capsule by mouth daily.   atorvastatin  40 MG tablet Commonly known as: LIPITOR Take 40 mg by mouth daily.   Ensure Max Protein Liqd Take 330 mLs (11 oz total) by mouth 2 (two) times daily.   Farxiga  10 MG Tabs tablet Generic drug: dapagliflozin propanediol  Take 1 tablet (10 mg total) by mouth daily.   fluticasone  50 MCG/ACT nasal spray Commonly known as: FLONASE  Place 1-2 sprays into the nose as needed for allergies.   furosemide 20 MG tablet Commonly known as:  LASIX Take 20 mg by mouth daily as needed for edema.   hydrALAZINE  50 MG tablet Commonly known as: APRESOLINE  TAKE 1 TABLET TWICE A DAY   isosorbide  dinitrate 30 MG tablet Commonly known as: ISORDIL  Take 1 tablet (30 mg total) by mouth 2 (two) times daily.   lidocaine  5 % Commonly known as: LIDODERM  Place 1 patch onto the skin daily. Remove & Discard patch within 12 hours or as directed by MD   linezolid  600 MG tablet Commonly known as: ZYVOX  Take 1 tablet (600 mg total) by mouth 2 (two) times daily for 5 days.   methocarbamol  500 MG tablet Commonly known as: ROBAXIN  Take 1 tablet (500 mg total) by mouth every 6 (six) hours as needed for muscle spasms.   metoprolol  succinate 50 MG 24 hr tablet Commonly known as: TOPROL -XL Take 25 tablets by mouth daily.   multivitamin with minerals Tabs tablet Take 1 tablet by mouth daily. Start taking on: June 10, 2024   olopatadine  0.1 % ophthalmic solution Commonly known as: PATANOL Apply 1-2 drops to eye 2 (two) times daily.   oxyCODONE  5 MG immediate release tablet Commonly known as: Oxy IR/ROXICODONE  Take 1 tablet (5 mg total) by mouth every 4 (four) hours as needed for moderate pain (pain score 4-6).   Rivaroxaban  15 MG Tabs tablet Commonly known as: XARELTO  Take 1 tablet (15 mg total) by mouth daily with supper.        Contact information for follow-up providers  Charlott Dorn LABOR, MD. Schedule an appointment as soon as possible for a visit in 1 week(s).   Specialty: Internal Medicine Contact information: [address] [place] [address] [phone]              Contact information for after-discharge care     Home Medical Care     [facility]) .   Service: Home Health Services Contact information: [address] [place]  [phone]                    Discharge Exam:   Subjective: Feeling better.  Has done some  therapy.  Wants to go home today.  Discussed with wife by telephone, who is in agreement but requests [facility] transport home since she cannot get him up the steps and into the house.   Objective: Vitals:   06/09/24 0500 06/09/24 1310  BP: 132/71 (!) 130/52  Pulse: 73 67  Resp: 18 18  Temp: 98.9 F (37.2 C) 99 F (37.2 C)  SpO2: 98%     Intake/Output Summary (Last 24 hours) at 06/09/2024 1325 Last data filed at 06/09/2024 0930 Gross per 24 hour  Intake 935.26 ml  Output 650 ml  Net 285.26 ml   Filed Weights   06/07/24 1534  Weight: 121.6 kg    Exam:  General:  Appears calm and comfortable and is in NAD Eyes:  normal lids, iris ENT:  grossly normal hearing, lips & tongue, mmm Cardiovascular:  RRR. No LE edema.  Respiratory:   CTA bilaterally with no wheezes/rales/rhonchi.  Normal respiratory effort. Abdomen:  soft, NT, ND Skin:  improving rash on R dorsal foot, erythema resolved; some peeling Musculoskeletal:  grossly normal tone BUE/BLE, good ROM, no bony abnormality Psychiatric:  grossly normal mood and affect, speech fluent and appropriate, ?[facility]CI Neurologic:  CN 2-12 grossly intact, moves all extremities in coordinated fashion  Data Reviewed: I have reviewed the patient's lab results since admission.  Pertinent labs for today include:   Glucsoe 117 BUN 16/Creatinine 1.28/GFR 58, stable WBC 7.6 Platelets 112, improved    Condition at discharge: improving  The results of significant diagnostics from this hospitalization (including imaging, microbiology, ancillary and laboratory) are listed below for reference.   Imaging Studies: VAS US  ABI WITH/WO TBI Result Date: 06/09/2024  LOWER EXTREMITY DOPPLER STUDY Patient Name:  Omar Chen  Date of Exam:   06/08/2024 Medical Rec #: [id number]       Accession #:    [id number] Date of Birth: August 13, 1945      Patient Gender: M Patient Age:   55 years Exam Location:  [facility] Procedure:      VAS US  ABI WITH/WO TBI  Referring Phys: DELON Shantella Blubaugh --------------------------------------------------------------------------------  Indications: Toe pain High Risk Factors: Hypertension, hyperlipidemia, Diabetes, no history of                    smoking.  Comparison Study: No prior exam. Performing Technologist: Edilia Elden Appl  Examination Guidelines: A complete evaluation includes at minimum, Doppler waveform signals and systolic blood pressure reading at the level of bilateral brachial, anterior tibial, and posterior tibial arteries, when vessel segments are accessible. Bilateral testing is considered an integral part of a complete examination. Photoelectric Plethysmograph (PPG) waveforms and toe systolic pressure readings are included as required and additional duplex testing as needed. Limited examinations for reoccurring indications may be performed as noted.  ABI  Findings: +---------+------------------+-----+-----------+--------+ Right    Rt Pressure (mmHg)IndexWaveform   Comment  +---------+------------------+-----+-----------+--------+ Brachial 184                    triphasic           +---------+------------------+-----+-----------+--------+ PTA      184               0.97 multiphasic         +---------+------------------+-----+-----------+--------+ DP       192               1.01 multiphasic         +---------+------------------+-----+-----------+--------+ Great Toe146               0.77 Normal              +---------+------------------+-----+-----------+--------+ +---------+------------------+-----+---------+-------+ Left     Lt Pressure (mmHg)IndexWaveform Comment +---------+------------------+-----+---------+-------+ Brachial 190                    triphasic        +---------+------------------+-----+---------+-------+ PTA      190               1.00 triphasic        +---------+------------------+-----+---------+-------+ DP       173               0.91 triphasic         +---------+------------------+-----+---------+-------+ Great Toe141               0.74 Normal           +---------+------------------+-----+---------+-------+ +-------+-----------+-----------+------------+------------+ ABI/TBIToday's ABIToday's TBIPrevious ABIPrevious TBI +-------+-----------+-----------+------------+------------+ Right  1.01       0.77                                +-------+-----------+-----------+------------+------------+ Left   1.00       0.74                                +-------+-----------+-----------+------------+------------+  Summary: Right: Resting right ankle-brachial index is within normal range. The right toe-brachial index is normal.  Left: Resting left ankle-brachial index is within normal range. The left toe-brachial index is normal.  *See table(s) above for measurements and observations.  Electronically signed by Penne Colorado MD on 06/09/2024 at 10:25:12 AM.    Final    MR FOOT RIGHT W WO CONTRAST Result Date: 06/08/2024 CLINICAL DATA:  Foot pain and swelling. EXAM: MRI OF THE RIGHT FOREFOOT WITHOUT AND WITH CONTRAST TECHNIQUE: Multiplanar, multisequence MR imaging of the right foot was performed before and after the administration of intravenous contrast. CONTRAST:  10mL GADAVIST  GADOBUTROL  1 MMOL/ML IV SOLN COMPARISON:  Radiographs same date. FINDINGS: Diffuse and marked subcutaneous soft tissue swelling/edema/fluid most notably involving the dorsal and lateral aspect of the foot. No discrete rim enhancing fluid collection to suggest a drainable abscess. No findings suspicious for myofasciitis or pyomyositis. Moderate scattered midfoot and forefoot degenerative changes but no MR findings to suggest a stress fracture, osteomyelitis or septic arthritis. The major tendons and ligaments are intact. IMPRESSION: 1. Diffuse and marked subcutaneous soft tissue swelling/edema/fluid most notably involving the dorsal and lateral aspect of the foot. No  discrete rim enhancing fluid collection to suggest a drainable abscess. 2. No findings suspicious for myofasciitis or pyomyositis. 3. No MR findings to suggest a  stress fracture, osteomyelitis or septic arthritis. Electronically Signed   By: MYRTIS Stammer M.D.   On: 06/08/2024 17:13   DG Foot 2 Views Right Result Date: 06/07/2024 CLINICAL DATA:  Provided history: osteo to base of toes? Questionable sepsis. EXAM: RIGHT FOOT - 2 VIEW COMPARISON:  None Available. FINDINGS: Patchy areas of decreased density involving the second through fifth metatarsal heads, nonspecific. Chronic appearing midfoot deformities with cystic changes. No evidence of acute fracture. Small plantar calcaneal spur. Forefoot and midfoot soft tissue edema. No radiopaque foreign body or soft tissue gas. IMPRESSION: 1. Patchy areas of decreased density involving the second through fifth metatarsal heads, nonspecific. This could represent early osteomyelitis. Consider MRI for further assessment. 2. Chronic appearing midfoot deformities with cystic changes. 3. Forefoot and midfoot soft tissue edema. Electronically Signed   By: Andrea Gasman M.D.   On: 06/07/2024 11:59   DG Chest Port 1 View Result Date: 06/07/2024 CLINICAL DATA:  Provided history: Questionable sepsis - evaluate for abnormality EXAM: PORTABLE CHEST 1 VIEW COMPARISON:  08/16/2021 FINDINGS: Mild cardiomegaly is grossly stable. Prominence of both hila, also stable. No evidence of focal airspace disease, large pleural effusion or pneumothorax. No pulmonary edema. IMPRESSION: 1. Stable cardiomegaly. No acute findings. 2. Stable prominence of both hila, likely due to prominent pulmonary arteries. Electronically Signed   By: Andrea Gasman M.D.   On: 06/07/2024 11:57    Microbiology: Results for orders placed or performed during the hospital encounter of 06/07/24  Blood Culture (routine x 2)     Status: None (Preliminary result)   Collection Time: 06/07/24 10:25 AM   Specimen:  BLOOD RIGHT WRIST  Result Value Ref Range Status   Specimen Description   Final    BLOOD RIGHT WRIST Performed at [facility], [address], [place], [place] [address]    Special Requests   Final    BOTTLES DRAWN AEROBIC AND ANAEROBIC Blood Culture adequate volume Performed at [facility], [address], [place], [place] [address]    Culture   Final    NO GROWTH 2 DAYS Performed at [facility], [address], [place], [place] [address]    Report Status PENDING  Incomplete  Resp panel by RT-PCR (RSV, Flu A&B, Covid) Anterior Nasal Swab     Status: None   Collection Time: 06/07/24 10:35 AM   Specimen: Anterior Nasal Swab  Result Value Ref Range Status   SARS Coronavirus 2 by RT PCR NEGATIVE NEGATIVE Final    Comment: (NOTE) SARS-CoV-2 target nucleic acids are NOT DETECTED.  The SARS-CoV-2 RNA is generally detectable in upper respiratory specimens during the acute phase of infection. The lowest concentration of SARS-CoV-2 viral copies this assay can detect is 138 copies/mL. A negative result does not preclude SARS-Cov-2 infection and should not be used as the sole basis for treatment or other patient management decisions. A negative result may occur with  improper specimen collection/handling, submission of specimen other than nasopharyngeal swab, presence of viral mutation(s) within the areas targeted by this assay, and inadequate number of viral copies(<138 copies/mL). A negative result must be combined with clinical observations, patient history, and epidemiological information. The expected result is Negative.  Fact Sheet for Patients:  bloggercourse.com  Fact Sheet for Healthcare Providers:  seriousbroker.it  This test is no t yet approved or cleared by the [place]  FDA and  has been authorized for detection and/or diagnosis of SARS-CoV-2 by FDA under an Emergency Use  Authorization (  EUA). This EUA will remain  in effect (meaning this test can be used) for the duration of the COVID-19 declaration under Section 564(b)(1) of the Act, 21 U.S.C.section 360bbb-3(b)(1), unless the authorization is terminated  or revoked sooner.       Influenza A by PCR NEGATIVE NEGATIVE Final   Influenza B by PCR NEGATIVE NEGATIVE Final    Comment: (NOTE) The Xpert Xpress SARS-CoV-2/FLU/RSV plus assay is intended as an aid in the diagnosis of influenza from Nasopharyngeal swab specimens and should not be used as a sole basis for treatment. Nasal washings and aspirates are unacceptable for Xpert Xpress SARS-CoV-2/FLU/RSV testing.  Fact Sheet for Patients: bloggercourse.com  Fact Sheet for Healthcare Providers: seriousbroker.it  This test is not yet approved or cleared by the [place]  FDA and has been authorized for detection and/or diagnosis of SARS-CoV-2 by FDA under an Emergency Use Authorization (EUA). This EUA will remain in effect (meaning this test can be used) for the duration of the COVID-19 declaration under Section 564(b)(1) of the Act, 21 U.S.C. section 360bbb-3(b)(1), unless the authorization is terminated or revoked.     Resp Syncytial Virus by PCR NEGATIVE NEGATIVE Final    Comment: (NOTE) Fact Sheet for Patients: bloggercourse.com  Fact Sheet for Healthcare Providers: seriousbroker.it  This test is not yet approved or cleared by the [place]  FDA and has been authorized for detection and/or diagnosis of SARS-CoV-2 by FDA under an Emergency Use Authorization (EUA). This EUA will remain in effect (meaning this test can be used) for the duration of the COVID-19 declaration under Section 564(b)(1) of the Act, 21 U.S.C. section 360bbb-3(b)(1), unless the authorization is terminated or revoked.  Performed at [facility]  [address], [place], [place] [address]   Blood Culture (routine x 2)     Status: None (Preliminary result)   Collection Time: 06/07/24 10:35 AM   Specimen: BLOOD LEFT WRIST  Result Value Ref Range Status   Specimen Description   Final    BLOOD LEFT WRIST Performed at [facility], [address], [place], [place] [address]    Special Requests   Final    BOTTLES DRAWN AEROBIC AND ANAEROBIC Blood Culture results may not be optimal due to an inadequate volume of blood received in culture bottles Performed at [facility], [address], [place], [place] [address]    Culture   Final    NO GROWTH 2 DAYS Performed at [facility], [address] [place], [place] [address]    Report Status PENDING  Incomplete    Labs: CBC: Recent Labs  Lab 06/07/24 1035 06/08/24 0555 06/09/24 0558  WBC 15.6* 8.6 7.6  NEUTROABS 13.8*  --   --   HGB 14.2 14.0 14.2  HCT 42.9 41.4 42.1  MCV 97.5 96.3 95.9  PLT 116* 107* 112*   Basic Metabolic Panel: Recent Labs  Lab 06/07/24 1035 06/08/24 0555 06/09/24 0558  NA 140 142 140  K 3.7 3.1* 3.9  CL 103 105 105  CO2 26 27 23   GLUCOSE 161* 109* 117*  BUN 23 17 16   CREATININE 1.59* 1.40* 1.28*  CALCIUM  8.8* 8.5* 8.4*   Liver Function Tests: Recent Labs  Lab 06/07/24 1035  AST 15  ALT 11  ALKPHOS 53  BILITOT 1.3*  PROT 6.4*  ALBUMIN 3.8   CBG: Recent Labs  Lab 06/08/24 1118 06/08/24 1703 06/08/24 2111 06/09/24 0720 06/09/24 1117  GLUCAP 135* 96 124* 117* 118*  Discharge time spent: greater than 30 minutes.  Signed: Delon Herald, MD [facility] 06/09/2024 "

## 2024-06-09 NOTE — Assessment & Plan Note (Addendum)
 Wears CPAP at home His wife is concerned that his CPAP is not working well at home Recommend outpatient sleep study

## 2024-06-09 NOTE — Evaluation (Signed)
 Occupational Therapy Evaluation Patient Details Name: Omar Chen MRN: [id number] DOB: May 21, 1946 Today's Date: 06/09/2024   History of Present Illness   Pt is a 79 y.o. male admitted for sepsis due to R foot cellulitis. Additionally c/o back pain. Imaging neg for acute foot fx or osteomyelitis. PMH significant for DM, HTN, HLD, morbid obesity, AFIB.     Clinical Impressions Pt admitted with above. Prior to admission, pt was independent without use of AD and lives with spouse. Pt with questionable cognition, A&O to basic information but lacks insight into complex situations. Performs bed mobility with cues for log roll technique, initial STS transfer using RW with MIN A fading to supervision, and functional mobility in hallway 150 ft using RW at supervision level. Setup for UB bathing at sink level seated in bathroom, MIN A for LB bathing due to body habitus. Discussed discharge recommendations and use of DME/AE (reacher, sock aide, dressing stick, BSC or shower seat) for increased ind and safety in home environment with pt and spouse on speaker phone. Pt would benefit from skilled OT services to address noted impairments and functional limitations (see below for any additional details) in order to maximize safety and independence while minimizing falls risk and caregiver burden. Anticipate the need for follow up [facility] OT services upon acute hospital DC.      If plan is discharge home, recommend the following:   A little help with walking and/or transfers;A little help with bathing/dressing/bathroom;Assist for transportation;Help with stairs or ramp for entrance;Supervision due to cognitive status     Functional Status Assessment   Patient has had a recent decline in their functional status and demonstrates the ability to make significant improvements in function in a reasonable and predictable amount of time.     Equipment Recommendations   BSC/3in1;Other (comment) (sock aide, dressing  stick)     Recommendations for Other Services         Precautions/Restrictions   Precautions Precautions: Fall;Back Restrictions Weight Bearing Restrictions Per Provider Order: No     Mobility Bed Mobility Overal bed mobility: Needs Assistance Bed Mobility: Rolling, Sidelying to Sit, Sit to Sidelying Rolling: Contact guard assist Sidelying to sit: Contact guard assist     Sit to sidelying: Contact guard assist General bed mobility comments: cues for log roll    Transfers Overall transfer level: Needs assistance Equipment used: Rolling walker (2 wheels) Transfers: Sit to/from Stand Sit to Stand: Min assist, Supervision           General transfer comment: minA for first STS, cues for hand placement and anterior weight shift. pt performs 8 STS remainder of session supervision level      Balance Overall balance assessment: Needs assistance, History of Falls Sitting-balance support: Feet supported, No upper extremity supported Sitting balance-Leahy Scale: Fair     Standing balance support: During functional activity, Reliant on assistive device for balance, Single extremity supported Standing balance-Leahy Scale: Fair Standing balance comment: able to static stand without support                           ADL either performed or assessed with clinical judgement   ADL Overall ADL's : Needs assistance/impaired         Upper Body Bathing: Sitting;Set up Upper Body Bathing Details (indicate cue type and reason): in chair sink level in bathroom Lower Body Bathing: Minimal assistance;Sit to/from stand Lower Body Bathing Details (indicate cue type and reason): difficulties with LB  bathing due to body habitus Upper Body Dressing : Sitting;Set up   Lower Body Dressing: Sit to/from stand;Minimal assistance Lower Body Dressing Details (indicate cue type and reason): dons underwear and pants, discussed use of reacher with pt verbalizing  understanding Toilet Transfer: Contact guard assist;Supervision/safety;Ambulation;Rolling walker (2 wheels) Toilet Transfer Details (indicate cue type and reason): stands for continent void at bathroom level Toileting- Clothing Manipulation and Hygiene: Supervision/safety;Sit to/from stand       Functional mobility during ADLs: Contact guard assist General ADL Comments: funtional mobility in hallway 150 ft      Pertinent Vitals/Pain Pain Assessment Pain Assessment: Faces Faces Pain Scale: Hurts a little bit Pain Location: back Pain Descriptors / Indicators: Sore Pain Intervention(s): Limited activity within patient's tolerance     Extremity/Trunk Assessment Upper Extremity Assessment Upper Extremity Assessment: Overall WFL for tasks assessed   Lower Extremity Assessment Lower Extremity Assessment: Defer to PT evaluation       Communication Communication Communication: No apparent difficulties   Cognition Arousal: Alert Behavior During Therapy: WFL for tasks assessed/performed Cognition: No family/caregiver present to determine baseline, Cognition impaired   Orientation impairments: Situation Awareness: Intellectual awareness impaired Memory impairment (select all impairments): Short-term memory, Working civil service fast streamer, Non-declarative long-term memory Attention impairment (select first level of impairment): Sustained attention Executive functioning impairment (select all impairments): Problem solving, Reasoning OT - Cognition Comments: questionable cognition. pt provides inconsistent answers to questions regarding back or R foot pain, poor STM noted, pt likes to joke. poor awareness of personal hygiene.                 Following commands: Intact       Cueing  General Comments   Cueing Techniques: Verbal cues;Gestural cues   Difficult to obtain clear picture of pain as pt does not provide consistent answers when asked about R foot or back pain, denies pain when  asked directly and seemingly unaware that he had c/o pain in back yesterday.            Home Living Family/patient expects to be discharged to:: Private residence Living Arrangements: Spouse/significant other Available Help at Discharge: Family Type of Home: House Home Access: Stairs to enter [employer] of Steps: 6-8 (or 10) Entrance Stairs-Rails: Right Home Layout: One level     Bathroom Shower/Tub: Producer, Television/film/video: Handicapped height Bathroom Accessibility: No   Home Equipment: Agricultural Consultant (2 wheels);Adaptive equipment Adaptive Equipment: Reacher        Prior Functioning/Environment Prior Level of Function : Independent/Modified Independent             Mobility Comments: independent ADLs Comments: independent    OT Problem List: Decreased strength;Decreased activity tolerance;Impaired balance (sitting and/or standing);Decreased cognition;Decreased safety awareness;Decreased knowledge of use of DME or AE;Decreased knowledge of precautions   OT Treatment/Interventions: Self-care/ADL training;Therapeutic exercise;Energy conservation;DME and/or AE instruction;Therapeutic activities;Cognitive remediation/compensation;Patient/family education;Balance training      OT Goals(Current goals can be found in the care plan section)   Acute Rehab OT Goals OT Goal Formulation: With patient/family Time For Goal Achievement: 06/23/24 Potential to Achieve Goals: Good   OT Frequency:  Min 2X/week       AM-PAC OT 6 Clicks Daily Activity     Outcome Measure Help from another person eating meals?: None Help from another person taking care of personal grooming?: None Help from another person toileting, which includes using toliet, bedpan, or urinal?: A Little Help from another person bathing (including washing, rinsing, drying)?: A Little Help  from another person to put on and taking off regular upper body clothing?: None Help from another  person to put on and taking off regular lower body clothing?: A Little 6 Click Score: 21   End of Session Equipment Utilized During Treatment: Gait belt;Rolling walker (2 wheels) Nurse Communication: Mobility status  Activity Tolerance: Patient tolerated treatment well;No increased pain Patient left: in bed;with call bell/phone within reach;with nursing/sitter in room;with bed alarm set  OT Visit Diagnosis: Muscle weakness (generalized) (M62.81);Other abnormalities of gait and mobility (R26.89);Unsteadiness on feet (R26.81)                Time: 9067-8986 OT Time Calculation (min): 41 min Charges:  OT General Charges $OT Visit: 1 Visit OT Evaluation $OT Eval Low Complexity: 1 Low OT Treatments $Self Care/Home Management : 23-37 mins  Luisdavid Hamblin L. Mamye Bolds, OTR/L  06/09/2024, 2:17 PM

## 2024-06-09 NOTE — Assessment & Plan Note (Addendum)
 Continue amlodipine, benazepril, hydralazine , Imdur, Toprol XL

## 2024-06-09 NOTE — Assessment & Plan Note (Addendum)
 Appears to be stable at this time Resume Farxiga  Attempt to avoid nephrotoxic medications

## 2024-06-09 NOTE — Assessment & Plan Note (Addendum)
"  Continue atorvastatin   "

## 2024-06-09 NOTE — Progress Notes (Signed)
 Physical Therapy Treatment Patient Details Name: Omar Chen MRN: [id number] DOB: 09/18/1945 Today's Date: 06/09/2024   History of Present Illness 79yo  who presented on 1/2 with generalized weakness. He was febrile with concern for sepsis due to R foot cellulitis. Xray concerning for osteomyelitis; MRI is pending. h/o DM, HTN, HLD, and morbid obesity    PT Comments  2nd attempt to see pt today. Pt agreeable with encouragement. Pt with decr STM, decr recall of events/PT yesterday or PT coming earlier this morning. Pt does recall ADLs with OT.  Pt amb ~30' with RW and supervision. Up/down 5 steps x2 with supervision , single handrail, no LOB or unsteadiness. Pt would be able to come via car from PT standpoint.  HHPT vs OPPT    If plan is discharge home, recommend the following: Assist for transportation   Can travel by private vehicle        Equipment Recommendations  None recommended by PT    Recommendations for Other Services       Precautions / Restrictions Precautions Precautions: Fall;Back Restrictions Weight Bearing Restrictions Per Provider Order: No     Mobility  Bed Mobility Overal bed mobility: Needs Assistance Bed Mobility: Rolling, Sidelying to Sit, Sit to Sidelying Rolling: Contact guard assist Sidelying to sit: Contact guard assist     Sit to sidelying: Contact guard assist General bed mobility comments: cues to log roll, CGA to progress LEs off and on to bed, no actual lifting assist    Transfers Overall transfer level: Needs assistance Equipment used: Rolling walker (2 wheels) Transfers: Sit to/from Stand Sit to Stand: Supervision           General transfer comment: cues for hand placement and safety    Ambulation/Gait Ambulation/Gait assistance: Contact guard assist, Supervision Gait Distance (Feet): 30 Feet Assistive device: Rolling walker (2 wheels) Gait Pattern/deviations: Step-through pattern, Decreased stride length, Wide base of  support, Trunk flexed       General Gait Details: cues for trunk/hip extension and RW proximity. no LOB. pt ambulated greater distance with OT, too fatigued at this time to progress distance   Stairs Stairs: Yes Stairs assistance: Supervision Stair Management: One rail Right, One rail Left, Step to pattern, Forwards Number of Stairs: 5 (x2) General stair comments: cues for safety, no LOB, use of bil rails on 1st trial, single rail on 2nd trial   Wheelchair Mobility     Tilt Bed    Modified Rankin (Stroke Patients Only)       Balance Overall balance assessment: Needs assistance, History of Falls Sitting-balance support: Feet supported, No upper extremity supported Sitting balance-Leahy Scale: Fair Sitting balance - Comments: testing limited d/t back pain   Standing balance support: During functional activity, Reliant on assistive device for balance, Single extremity supported Standing balance-Leahy Scale: Fair Standing balance comment: able to static stand without support                            Communication Communication Communication: No apparent difficulties  Cognition Arousal: Alert Behavior During Therapy: WFL for tasks assessed/performed   PT - Cognitive impairments: Memory, Safety/Judgement                       PT - Cognition Comments: pt with decr recall of events of yesterday. requires cues throughout session for safety. Following commands: Intact      Cueing Cueing Techniques: Verbal cues, Gestural cues  Exercises      General Comments        Pertinent Vitals/Pain Pain Assessment Pain Assessment: Faces Faces Pain Scale: Hurts little more Pain Location: back and thighs Pain Descriptors / Indicators: Sore Pain Intervention(s): Limited activity within patient's tolerance, Monitored during session, Repositioned    Home Living                          Prior Function            PT Goals (current goals can  now be found in the care plan section) Acute Rehab PT Goals Patient Stated Goal: feel better and be able to make martinis PT Goal Formulation: With patient/family Time For Goal Achievement: 06/22/24 Potential to Achieve Goals: Good Progress towards PT goals: Progressing toward goals    Frequency    Min 3X/week      PT Plan      Co-evaluation              AM-PAC PT 6 Clicks Mobility   Outcome Measure  Help needed turning from your back to your side while in a flat bed without using bedrails?: A Little Help needed moving from lying on your back to sitting on the side of a flat bed without using bedrails?: A Little Help needed moving to and from a bed to a chair (including a wheelchair)?: A Little Help needed standing up from a chair using your arms (e.g., wheelchair or bedside chair)?: A Little Help needed to walk in hospital room?: A Little Help needed climbing 3-5 steps with a railing? : A Little 6 Click Score: 18    End of Session Equipment Utilized During Treatment: Gait belt Activity Tolerance: Patient tolerated treatment well Patient left: in bed;with call bell/phone within reach;with bed alarm set Nurse Communication: Mobility status PT Visit Diagnosis: Other abnormalities of gait and mobility (R26.89);Pain Pain - part of body:  (back)     Time: 8763-8750 PT Time Calculation (min) (ACUTE ONLY): 13 min  Charges:    $Gait Training: 8-22 mins PT General Charges $$ ACUTE PT VISIT: 1 Visit                     Abbeygail Igoe, PT  Acute Rehab Dept [facility]) [phone]  06/09/2024    [facility] 06/09/2024, 1:14 PM

## 2024-06-09 NOTE — Assessment & Plan Note (Addendum)
 Nutrition Problem: Increased nutrient needs Etiology: wound healing Signs/Symptoms: estimated needs Interventions: Refer to RD note for recommendations, MVI, Premier Protein

## 2024-06-09 NOTE — Assessment & Plan Note (Addendum)
 SIRS criteria in this patient includes: Leukocytosis, fever with source of R foot cellulitis (possible osteo) Patient had evidence of acute organ failure with elevated lactate >2 that is not easily explained by another condition. 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 He appears to have been given Keflex on 12/27 and so has failed outpatient management Treating with antibiotics (Unasyn ) as per the lower extremity wound algorithm -> PO Linezolid  to complete 7 days of treatment ABIs negative LE wound order set utilized including labs (CRP, ESR, A1c, prealbumin, HIV, and blood cultures) and consults (diabetes coordinator; peripheral vascular navigator; TOC team; wound care; and nutrition) Will place in observation status with telemetry and continue to monitor MRI negative for osteo Cellulitis appears to be improving Generalized weakness associated with above; will need HH PT/OT, RN for wound care, and aide Will order clotrimazole cream for foot for fungal component

## 2024-06-09 NOTE — Assessment & Plan Note (Addendum)
 He does not appear to be taking medications for this issue currently

## 2024-06-09 NOTE — Assessment & Plan Note (Addendum)
 Rate controlled with Toprol XL Continue Xarelto 

## 2024-06-09 NOTE — Assessment & Plan Note (Addendum)
 Body mass index is 38.47 kg/m.SABRA  Weight loss should be encouraged Outpatient PCP/bariatric medicine f/u encouraged Significantly low or high BMI is associated with higher medical risk including morbidity and mortality

## 2024-06-09 NOTE — Plan of Care (Signed)
?  Problem: Clinical Measurements: ?Goal: Will remain free from infection ?Outcome: Not Progressing ?  ?Problem: Activity: ?Goal: Risk for activity intolerance will decrease ?Outcome: Not Progressing ?  ?Problem: Elimination: ?Goal: Will not experience complications related to bowel motility ?Outcome: Not Progressing ?  ?

## 2024-06-09 NOTE — Assessment & Plan Note (Addendum)
 Reported back pain on presentation He is sedentary and obese at baseline No obvious trauma Reports mostly low back pain, particularly with movement Continue Lidocaine  and Robaxin  plus Tylenol  for now as needed

## 2024-06-09 NOTE — Assessment & Plan Note (Addendum)
 Last A1c was 5.9 remotely, will recheck Appears to be diet controlled

## 2024-06-12 LAB — CULTURE, BLOOD (ROUTINE X 2)
Culture: NO GROWTH
Culture: NO GROWTH
Special Requests: ADEQUATE

## 2024-06-25 ENCOUNTER — Encounter (HOSPITAL_BASED_OUTPATIENT_CLINIC_OR_DEPARTMENT_OTHER): Admitting: General Surgery

## 2024-07-15 ENCOUNTER — Encounter (HOSPITAL_BASED_OUTPATIENT_CLINIC_OR_DEPARTMENT_OTHER): Admitting: General Surgery
